# Patient Record
Sex: Female | Born: 1965
Health system: Southern US, Community
[De-identification: ages and names within clinical notes are randomized; demographics above are authoritative.]

## PROBLEM LIST (undated history)

## (undated) DIAGNOSIS — I10 Essential (primary) hypertension: Secondary | ICD-10-CM

## (undated) DIAGNOSIS — E785 Hyperlipidemia, unspecified: Secondary | ICD-10-CM

## (undated) DIAGNOSIS — F419 Anxiety disorder, unspecified: Secondary | ICD-10-CM

## (undated) DIAGNOSIS — F329 Major depressive disorder, single episode, unspecified: Secondary | ICD-10-CM

## (undated) DIAGNOSIS — F32A Depression, unspecified: Secondary | ICD-10-CM

## (undated) DIAGNOSIS — K219 Gastro-esophageal reflux disease without esophagitis: Secondary | ICD-10-CM

## (undated) DIAGNOSIS — K8020 Calculus of gallbladder without cholecystitis without obstruction: Secondary | ICD-10-CM

## (undated) HISTORY — DX: Essential (primary) hypertension: I10

## (undated) HISTORY — DX: Depression, unspecified: F32.A

## (undated) HISTORY — DX: Gastro-esophageal reflux disease without esophagitis: K21.9

## (undated) HISTORY — DX: Hyperlipidemia, unspecified: E78.5

## (undated) HISTORY — DX: Calculus of gallbladder without cholecystitis without obstruction: K80.20

## (undated) HISTORY — DX: Anxiety disorder, unspecified: F41.9

## (undated) HISTORY — DX: Major depressive disorder, single episode, unspecified: F32.9

---

## 2004-08-27 HISTORY — PX: CHOLECYSTECTOMY: SHX55

## 2009-08-27 HISTORY — PX: CERVICAL SPINE SURGERY: SHX589

## 2017-08-09 ENCOUNTER — Encounter: Payer: Self-pay | Admitting: Family Medicine

## 2017-08-09 ENCOUNTER — Ambulatory Visit (INDEPENDENT_AMBULATORY_CARE_PROVIDER_SITE_OTHER): Payer: BLUE CROSS/BLUE SHIELD | Admitting: Family Medicine

## 2017-08-09 VITALS — BP 126/80 | HR 79 | Temp 97.9°F | Resp 12 | Ht 68.39 in | Wt 222.4 lb

## 2017-08-09 DIAGNOSIS — L989 Disorder of the skin and subcutaneous tissue, unspecified: Secondary | ICD-10-CM | POA: Diagnosis not present

## 2017-08-09 DIAGNOSIS — I10 Essential (primary) hypertension: Secondary | ICD-10-CM

## 2017-08-09 DIAGNOSIS — Z1211 Encounter for screening for malignant neoplasm of colon: Secondary | ICD-10-CM

## 2017-08-09 DIAGNOSIS — E785 Hyperlipidemia, unspecified: Secondary | ICD-10-CM | POA: Insufficient documentation

## 2017-08-09 DIAGNOSIS — F3341 Major depressive disorder, recurrent, in partial remission: Secondary | ICD-10-CM

## 2017-08-09 DIAGNOSIS — Z1231 Encounter for screening mammogram for malignant neoplasm of breast: Secondary | ICD-10-CM

## 2017-08-09 DIAGNOSIS — K219 Gastro-esophageal reflux disease without esophagitis: Secondary | ICD-10-CM

## 2017-08-09 DIAGNOSIS — Z1239 Encounter for other screening for malignant neoplasm of breast: Secondary | ICD-10-CM

## 2017-08-09 DIAGNOSIS — Z23 Encounter for immunization: Secondary | ICD-10-CM | POA: Diagnosis not present

## 2017-08-09 LAB — LIPID PANEL
CHOLESTEROL: 254 mg/dL — AB (ref 0–200)
HDL: 71 mg/dL (ref >39.00)
LDL Cholesterol: 167 mg/dL — ABNORMAL HIGH (ref 0–99)
NonHDL: 183.14
Total CHOL/HDL Ratio: 4
Triglycerides: 83 mg/dL (ref 0.0–149.0)
VLDL: 16.6 mg/dL (ref 0.0–40.0)

## 2017-08-09 LAB — COMPREHENSIVE METABOLIC PANEL
ALT: 19 U/L (ref 0–35)
AST: 19 U/L (ref 0–37)
Albumin: 4.2 g/dL (ref 3.5–5.2)
Alkaline Phosphatase: 57 U/L (ref 39–117)
BUN: 17 mg/dL (ref 6–23)
CALCIUM: 9.2 mg/dL (ref 8.4–10.5)
CHLORIDE: 100 meq/L (ref 96–112)
CO2: 29 meq/L (ref 19–32)
Creatinine, Ser: 0.79 mg/dL (ref 0.40–1.20)
GFR: 81.61 mL/min (ref >60.00)
GLUCOSE: 102 mg/dL — AB (ref 70–99)
Potassium: 3.8 mEq/L (ref 3.5–5.1)
SODIUM: 138 meq/L (ref 135–145)
Total Bilirubin: 0.5 mg/dL (ref 0.2–1.2)
Total Protein: 6.9 g/dL (ref 6.0–8.3)

## 2017-08-09 LAB — VITAMIN D 25 HYDROXY (VIT D DEFICIENCY, FRACTURES): VITD: 17.41 ng/mL — ABNORMAL LOW (ref 30.00–100.00)

## 2017-08-09 MED ORDER — HYDROCHLOROTHIAZIDE 12.5 MG PO TABS: 12.5000 mg | ORAL_TABLET | Freq: Every day | ORAL | 2 refills | Status: DC

## 2017-08-09 MED ORDER — ESCITALOPRAM OXALATE 20 MG PO TABS: 20.0000 mg | ORAL_TABLET | Freq: Every day | ORAL | 2 refills | Status: DC

## 2017-08-09 MED ORDER — IRBESARTAN 300 MG PO TABS: 300.0000 mg | ORAL_TABLET | Freq: Every day | ORAL | 2 refills | Status: DC

## 2017-08-09 NOTE — Patient Instructions (Addendum)
A few things to remember from today's visit:   Gastroesophageal reflux disease, esophagitis presence not specified  Hypertension, essential, benign - Plan: Comprehensive metabolic panel, irbesartan (AVAPRO) 300 MG tablet, hydrochlorothiazide (HYDRODIURIL) 12.5 MG tablet  Hyperlipidemia, unspecified hyperlipidemia type - Plan: Lipid panel, Comprehensive metabolic panel  Depression, major, recurrent, in partial remission ([place]) - Plan: VITAMIN D 25 Hydroxy (Vit-D Deficiency, Fractures), escitalopram (LEXAPRO) 20 MG tablet  Breast cancer screening - Plan: MM SCREENING BREAST TOMO BILATERAL  Colon cancer screening - Plan: Ambulatory referral to Gastroenterology   GERD:  Avoid foods that make your symptoms worse, for example coffee, chocolate,pepermeint,alcohol, and greasy food. Raising the head of your bed about 6 inches may help with nocturnal symptoms.  Avoid tobacco use. Weight loss (if you are overweight). Avoid lying down for 3 hours after eating.  Instead 3 large meals daily try small and more frequent meals during the day.  Every medication have side effects and medications for GERD are not the exception.At this time I think benefit is greater than risk.    You should be evaluated immediately if bloody vomiting, bloody stools, black stools (like tar), difficulty swallowing, food gets stuck on the way down or choking when eating. Abnormal weight loss or severe abdominal pain.  If symptoms are not resolved sometimes endoscopy is necessary.   Please be sure medication list is accurate. If a new problem present, please set up appointment sooner than planned today.

## 2017-08-09 NOTE — Progress Notes (Signed)
HPI:   Angela Everett is a 51 y.o. female, who is here today to establish care.  Former PCP: N/A, she just moved from [place] in 04/2017. Last preventive routine visit: 03/2017  Chronic medical problems: HTN,HLD, depression,anxiety among some.  She does not exercise regularly and has not followed a healthy diet in the past 6 months. She is planning on starting a healthier life style.  Hypertension:   Dx in 1999. Currently on Irbesartan 300 mg daily and HCTZ 12.5 mg daily. Last eye exam 11/2016.   Father with Hx of Marfan synd , she is reporting negative cardiac work up in 2015, including echo.  She is taking medications as instructed, no side effects reported.  She has not noted unusual headache, visual changes, exertional chest pain, dyspnea,  focal weakness, or edema.    Hyperlipidemia:  Currently on non pharmacologic treatment. Following a low fat diet: Not consistently.  She took medication in the past, discontinued because she changed her diet and PCP felt like med was not needed.   Depression and anxiety:  Dx at at age 70. + PTSD. She was not longer following with psychiatrist, last time in 2009. She has been following with PCP. Currently she is on Celexa 20 mg daily.  She is sleeping "Ok",sometimes wakes up a few times per night.  She denies suicidal thoughts.   Concerns today: Dermatology referral. Skin lesion noted at least 4 years ago, not changed but she thinkks it should be evaluated by dermatologist. She was following with PCP annually.   GERD: She is not on PPI. Takes OTC Tums. Heartburn,exacerbated by certain food intake,mainly fast food.  Denies abdominal pain, nausea, vomiting, changes in bowel habits, blood in stool or melena.   Review of Systems  Constitutional: Positive for fatigue. Negative for activity change, appetite change and fever.  HENT: Negative for mouth sores, nosebleeds and trouble swallowing.   Eyes: Negative  for redness and visual disturbance.  Respiratory: Negative for cough, shortness of breath and wheezing.   Cardiovascular: Negative for chest pain, palpitations and leg swelling.  Gastrointestinal: Negative for abdominal pain, nausea and vomiting.       Negative for changes in bowel habits.  Endocrine: Negative for cold intolerance, heat intolerance, polydipsia, polyphagia and polyuria.  Genitourinary: Negative for decreased urine volume, dysuria and hematuria.  Musculoskeletal: Negative for gait problem and myalgias.  Neurological: Negative for seizures, syncope, weakness and headaches.  Psychiatric/Behavioral: Positive for sleep disturbance. Negative for confusion, hallucinations and suicidal ideas. The patient is nervous/anxious.       No current outpatient medications on file prior to visit.   No current facility-administered medications on file prior to visit.      Past Medical History:  Diagnosis Date  . Anxiety   . Depression   . GERD (gastroesophageal reflux disease)   . Hyperlipidemia   . Hypertension    Allergies  Allergen Reactions  . Latex     Family History  Problem Relation Age of Onset  . Depression Mother   . Heart disease Mother   . Hypertension Mother   . Birth defects Father   . Cancer Father   . Heart disease Father   . Alcohol abuse Father   . Marfan syndrome Father   . ADD / ADHD Daughter   . Alcohol abuse Daughter   . Depression Daughter   . Drug abuse Daughter   . Learning disabilities Daughter   . Depression Son   . Heart  disease Maternal Grandmother   . Heart attack Maternal Grandmother   . Cancer Paternal Grandmother   . Early death Paternal Grandmother   . Alcohol abuse Paternal Grandfather   . Cancer Paternal Grandfather   . Depression Paternal Grandfather   . Depression Daughter   . Hearing loss Daughter     Social History   Socioeconomic History  . Marital status: Legally Separated    Spouse name: None  . Number of  children: None  . Years of education: None  . Highest education level: None  Social Needs  . Financial resource strain: None  . Food insecurity - worry: None  . Food insecurity - inability: None  . Transportation needs - medical: None  . Transportation needs - non-medical: None  Occupational History  . None  Tobacco Use  . Smoking status: Never Smoker  . Smokeless tobacco: Never Used  Substance and Sexual Activity  . Alcohol use: No    Frequency: Never  . Drug use: No  . Sexual activity: No  Other Topics Concern  . None  Social History Narrative  . None    Vitals:   08/09/17 0744  BP: 126/80  Pulse: 79  Resp: 12  Temp: 97.9 F (36.6 C)  SpO2: 97%    Body mass index is 33.43 kg/m.   Physical Exam  Constitutional: She is oriented to person, place, and time. She appears well-developed. No distress.  HENT:  Head: Atraumatic.  Mouth/Throat: Oropharynx is clear and moist and mucous membranes are normal.  Eyes: Conjunctivae and EOM are normal. Pupils are equal, round, and reactive to light.  Cardiovascular: Normal rate and regular rhythm.  No murmur heard. Pulses:      Dorsalis pedis pulses are 2+ on the right side, and 2+ on the left side.  Respiratory: Effort normal and breath sounds normal. No respiratory distress.  GI: Soft. She exhibits no mass. There is no hepatomegaly. There is no tenderness.  Musculoskeletal: She exhibits no edema.  Lymphadenopathy:    She has no cervical adenopathy.  Neurological: She is alert and oriented to person, place, and time. She has normal strength. Gait normal.  Skin: Skin is warm. Lesion noted. No rash noted. No erythema.     6 mm, mildly hard, rounded lesion.Regular borders, dry,no tender.   Psychiatric: Her mood appears anxious.  Fairly groomed, good eye contact.      ASSESSMENT AND PLAN:   Ms. Abella was seen today for establish care.  Diagnoses and all orders for this visit:  Lab Results  Component Value Date    ALT 19 08/09/2017   AST 19 08/09/2017   ALKPHOS 57 08/09/2017   BILITOT 0.5 08/09/2017   Lab Results  Component Value Date   CREATININE 0.79 08/09/2017   BUN 17 08/09/2017   NA 138 08/09/2017   K 3.8 08/09/2017   CL 100 08/09/2017   CO2 29 08/09/2017   Lab Results  Component Value Date   CHOL 254 (H) 08/09/2017   HDL 71.00 08/09/2017   LDLCALC 167 (H) 08/09/2017   TRIG 83.0 08/09/2017   CHOLHDL 4 08/09/2017    Gastroesophageal reflux disease, esophagitis presence not specified  GERD precautions discussed. She agrees with starting Omeprazole 40 mg daily for 2-3 months,then dose could be decreased to 20 mg if symptoms well controlled.  Hypertension, essential, benign  Adequately controlled. No changes in current management. DASH diet recommended. Eye exam recommended annually. F/U in 5-6 months, before if needed.  -  Comprehensive metabolic panel -     irbesartan (AVAPRO) 300 MG tablet; Take 1 tablet (300 mg total) by mouth daily. -     hydrochlorothiazide (HYDRODIURIL) 12.5 MG tablet; Take 1 tablet (12.5 mg total) by mouth daily.  Hyperlipidemia, unspecified hyperlipidemia type  Continue non pharmacologic treatment. We will follow labs done today and will give further recommendations accordingly.  The 10-year ASCVD risk score Mikey Bussing DC Brooke Bonito., et al., 2013) is: 1.6%   Values used to calculate the score:     Age: 45 years     Sex: Female     Is Non-Hispanic African American: No     Diabetic: No     Tobacco smoker: No     Systolic Blood Pressure: 253 mmHg     Is BP treated: Yes     HDL Cholesterol: 71 mg/dL     Total Cholesterol: 254 mg/dL  -     Lipid panel -     Comprehensive metabolic panel  Depression, major, recurrent, in partial remission (HCC)  Stable otherwise. No changes in current management. Instructed about warning signs. F/U in 5 months.  -     VITAMIN D 25 Hydroxy (Vit-D Deficiency, Fractures) -     escitalopram (LEXAPRO) 20 MG tablet;  Take 1 tablet (20 mg total) by mouth daily.  Breast cancer screening -     MM SCREENING BREAST TOMO BILATERAL; Future  Colon cancer screening -     Ambulatory referral to Gastroenterology  Skin lesion of left arm  ? Fibroma. Dermatology referral placed as requested.  -     Ambulatory referral to Dermatology       Betty G. Martinique, MD  [facility]. [place] office.

## 2017-08-10 ENCOUNTER — Encounter: Payer: Self-pay | Admitting: Family Medicine

## 2017-08-26 ENCOUNTER — Encounter: Payer: Self-pay | Admitting: *Deleted

## 2017-10-10 ENCOUNTER — Encounter: Payer: Self-pay | Admitting: Family Medicine

## 2017-11-25 ENCOUNTER — Encounter: Payer: Self-pay | Admitting: Family Medicine

## 2017-11-25 ENCOUNTER — Ambulatory Visit: Payer: BLUE CROSS/BLUE SHIELD | Admitting: Family Medicine

## 2017-11-25 ENCOUNTER — Ambulatory Visit: Payer: Self-pay | Admitting: *Deleted

## 2017-11-25 VITALS — BP 138/70 | HR 104 | Temp 98.2°F | Wt 223.0 lb

## 2017-11-25 DIAGNOSIS — L247 Irritant contact dermatitis due to plants, except food: Secondary | ICD-10-CM

## 2017-11-25 MED ORDER — PREDNISONE 20 MG PO TABS: ORAL_TABLET | ORAL | 0 refills | Status: DC

## 2017-11-25 MED ORDER — CETIRIZINE HCL 10 MG PO TABS: 10.0000 mg | ORAL_TABLET | Freq: Every day | ORAL | 0 refills | Status: AC

## 2017-11-25 MED ORDER — METHYLPREDNISOLONE ACETATE 80 MG/ML IJ SUSP: 80.0000 mg | Freq: Once | INTRAMUSCULAR | Status: DC

## 2017-11-25 NOTE — Progress Notes (Signed)
Subjective:    Patient ID: Angela Everett, female    DOB: [birth year], 52 y.o.   MRN: [id number]  Chief Complaint  Patient presents with  . Rash  . Urticaria    HPI Patient was seen today for rash.  Patient states on Saturday she was doing yard work including tearing down bushes and moving an old fence.  Pt was wearing a short sleeve t-shirt.  Pt states since then she has noticed edema and vesicular rash on her arms and L face, which is also pruritic.  Patient endorses having sensitive skin and issues with idiopathic hives.  Patient has taken Benadryl 200 mg for her symptoms today with no relief.  Patient states the right side of her face is now starting to become irritated.  Past Medical History:  Diagnosis Date  . Anxiety   . Depression   . GERD (gastroesophageal reflux disease)   . Hyperlipidemia   . Hypertension     Allergies  Allergen Reactions  . Latex     ROS General: Denies fever, chills, night sweats, changes in weight, changes in appetite HEENT: Denies headaches, ear pain, changes in vision, rhinorrhea, sore throat CV: Denies CP, palpitations, SOB, orthopnea Pulm: Denies SOB, cough, wheezing GI: Denies abdominal pain, nausea, vomiting, diarrhea, constipation GU: Denies dysuria, hematuria, frequency, vaginal discharge Msk: Denies muscle cramps, joint pains Neuro: Denies weakness, numbness, tingling Skin: Denies rashes, bruising  +rash on forearms and face Psych: Denies depression, anxiety, hallucinations     Objective:    Blood pressure 138/70, pulse (!) 104, temperature 98.2 F (36.8 C), temperature source Oral, weight 223 lb (101.2 kg), SpO2 98 %.   Gen. Pleasant, well-nourished, in no distress, normal affect   HEENT: [place]/AT, face symmetric, no scleral icterus, PERRLA, nares patent without drainage, pharynx without erythema or exudate. Lungs: no accessory muscle use, CTAB, no wheezes or rales Cardiovascular: RRR, no m/r/g, no peripheral edema Neuro:   A&Ox3, CN II-XII intact, normal gait Skin:  Warm, dry.  A pruritic line of vesicles on bilateral forearms with erythema.  Left sided facial and periorbital edema noted.  Mild erythema in plaques on right face and left neck.   Wt Readings from Last 3 Encounters:  11/25/17 223 lb (101.2 kg)  08/09/17 222 lb 6 oz (100.9 kg)    Lab Results  Component Value Date   GLUCOSE 102 (H) 08/09/2017   CHOL 254 (H) 08/09/2017   TRIG 83.0 08/09/2017   HDL 71.00 08/09/2017   LDLCALC 167 (H) 08/09/2017   ALT 19 08/09/2017   AST 19 08/09/2017   NA 138 08/09/2017   K 3.8 08/09/2017   CL 100 08/09/2017   CREATININE 0.79 08/09/2017   BUN 17 08/09/2017   CO2 29 08/09/2017    Assessment/Plan:  Irritant contact dermatitis due to plants, except food  -Likely secondary to dairy Urishiol oil from poison ivy, sumac, or oak. -Patient encouraged not to scratch as to prevent secondary bacterial infection -Given Depo-Medrol IM in clinic -Given Rx for prednisone taper -Can also use cortisone as needed -Given handout -Patient given RTC or ED precautions - Plan: predniSONE (DELTASONE) 20 MG tablet, cetirizine (ZYRTEC) 10 MG tablet, methylPREDNISolone acetate (DEPO-MEDROL) injection 80 mg  Follow-up PRN  Grier Mitts, MD

## 2017-11-25 NOTE — Patient Instructions (Signed)
Poison Oak Dermatitis  Poison oak dermatitis is inflammation of the skin that is caused by contact with the allergens on the leaves of the poison oak (toxicodendron) plant. The skin reaction often includes redness, swelling, blisters, and extreme itching.  What are the causes?  This condition is caused by a specific chemical (urushiol) that is found in the sap of the poison oak plant. This chemical is sticky and it can be easily spread to people, animals, and objects. You can get poison oak dermatitis by:  · Having direct contact with a poison oak plant.  · Touching animals, other people, or objects that have come in contact with poison oak and have the chemical on them.    What increases the risk?  This condition is more likely to develop in people who:  · Are outdoors often.  · Go outdoors without wearing protective clothing, such as closed shoes, long pants, and a long-sleeved shirt.    What are the signs or symptoms?  Symptoms of this condition include:  · Redness of the skin.  · A rash that may develop blisters.  · Extreme itching.  · Swelling. This may occur if the reaction is more severe.    Symptoms usually last for 1-2 weeks. However, the first time you develop this condition, symptoms may last 3-4 weeks.  How is this diagnosed?  This condition may be diagnosed based on your symptoms and a physical exam. Your health care provider may also ask you about any recent outdoor activity.  How is this treated?  Treatment for this condition will vary depending on how severe it is. Treatment may include:  · Hydrocortisone creams or calamine lotions to relieve itching.  · Oatmeal baths to soothe the skin.  · Over-the-counter antihistamine tablets.  · Oral steroid medicine for more severe outbreaks.    Follow these instructions at home:  · Take or apply over-the-counter and prescription medicines only as told by your health care provider.  · Wash exposed skin as soon as possible with soap and cold water.  · Use  hydrocortisone creams or calamine lotion as needed to soothe the skin and relieve itching.  · Take oatmeal baths as needed. Use colloidal oatmeal. You can get this at your local pharmacy or grocery store. Follow the instructions on the packaging.  · Do not scratch or rub your skin.  · While you have the rash, wash clothes right after you wear them.  How is this prevented?  · Learn to identify the poison oak plant and avoid contact with the plant. This plant can be recognized by the number of leaves. Generally, poison oak has three leaves with flowering branches on a single stem. The leaves are often a bit fuzzy and have a toothlike edge.  · If you have been exposed to poison oak, thoroughly wash with soap and water right away. You have about 30 minutes to remove the plant resin before it will cause the rash. Be sure to wash under your fingernails because any plant resin there will continue to spread the rash.  · When hiking or camping, wear clothes that will help you avoid exposure on the skin. This includes long pants, a long-sleeved shirt, tall socks, and hiking boots. You can also apply preventive lotion to your skin to help limit exposure.  · If you suspect that your clothes or outdoor gear came in contact with poison oak, rinse them off outside with a garden hose before bringing them inside your house.    Contact a health care provider if:  · You have open sores in the rash area.  · You have more redness, swelling, or pain in the affected area.  · You have redness that spreads beyond the rash area.  · You have fluid, blood, or pus coming from the affected area.  · You have a fever.  · You have a rash over a large area of your body.  · You have a rash on your eyes, mouth, or genitals.  · Your rash does not improve after a few days.  Get help right away if:  · Your face swells or your eyes swell shut.  · You have trouble breathing.  · You have trouble swallowing.  This information is not intended to replace advice  given to you by your health care provider. Make sure you discuss any questions you have with your health care provider.  Document Released: 02/17/2003 Document Revised: 01/19/2016 Document Reviewed: 01/19/2015  Elsevier Interactive Patient Education © 2018 Elsevier Inc.

## 2017-11-25 NOTE — Telephone Encounter (Signed)
  Reason for Disposition . [1] Severe poison ivy, oak, or sumac reaction in the past AND [2] face involved  Answer Assessment - Initial Assessment Questions Left side of face with tiny blisters and hives that itch. Some swelling of face where the rash is noticed. Denies any difficultly breathing or chest pain. She noticed the rash 24 hours after doing yard work on Saturday.    1. APPEARANCE of RASH: "Describe the rash."     Hives and tiny blisters. 2. LOCATION: "Where is the rash located?"  Forearm hives, yesterday noticed a straight line path of tiny blister. Left side of face has hives and several small blisters. 3. SIZE: "How large is the rash?"      Spread out tiny blisters 4. ONSET: "When did the rash begin?"     Sunday morning 24 hours after pulling weeds on Saturday. 5. ITCHING: "Does the rash itch?" If so, ask: "How bad is it?"   - MILD - doesn't interfere with normal activities   - MODERATE-SEVERE: interferes with work, school, sleep, or other activities     Yes, Mild. 6. PREGNANCY: "Is there any chance you are pregnant?" "When was your last menstrual period?" no  Protocols used: POISON IVY - OAK - SUMAC-A-AH

## 2017-11-26 ENCOUNTER — Ambulatory Visit: Payer: BLUE CROSS/BLUE SHIELD | Admitting: Family Medicine

## 2018-02-12 ENCOUNTER — Ambulatory Visit: Payer: Self-pay

## 2018-02-12 NOTE — Telephone Encounter (Signed)
Returned call to pt. to discuss ankle wound.  Stated she noticed right lateral ankle sore about one month ago.  Reported this is located directly over a tatoo, that she has had for 20 years.  Reported about 1.5 weeks ago, she started treating it by soaking in Epsom Salts, and applying Neosporin Oint.  Described area that "looks like a crater; feels like it has a scab, but doesn't look like a scab."  Reported the size is just smaller than a dime, is sore to touch, and has intermittent redness.  Stated it intermittently leaks a "clear, dark gray fluid."  Denied fever/ chills.  Stated she has been very diligent to treat it for the last 1.5 weeks, and has not seen any healing.  Also, reported pain and stiffness in hands and elbows.  Appt. offered for 6/20; unable to accept the appt. on 6/20, due to her job.  Appt given for 6/21 @ 10:30 AM.  Agrees with plan. Care Advice given per protocol  Verb. Understanding.         Reason for Disposition . [1] Using antibiotic ointment > 1 week AND [2] sore not completely healed  Answer Assessment - Initial Assessment Questions 1. APPEARANCE of SORES: "What do the sores look like?"     Looks like a crater; it feels like a scab , but doesn't look like a scab;  hard center like a lump, sore to touch,  intermittent redness, 2. NUMBER: "How many sores are there?"     One  3. SIZE: "How big is the largest sore?"     Diameter is about just less than dime size 4. LOCATION: "Where are the sores located?"     Lateral right ankle 5. ONSET: "When did the sores begin?"     About one month ago 6. CAUSE: "What do you think is causing the sores?"     Unknown 7. OTHER SYMPTOMS: "Do you have any other symptoms?" (e.g., fever, new weakness)     Denied fever / chills; intermittent drainage of clear, dark gray  Protocols used: SORES-A-AH  Message from Cleaster Corin, NT sent at 02/12/2018 12:09 PM EDT   Summary: wound on anle not healing           Pt. Calling about right  Ankle open wound that hasn't healed and its been about a month now. Pt. Has noticed some drainage (clear and dark gray)red, hard center, and sore to the touch.

## 2018-02-14 ENCOUNTER — Ambulatory Visit: Payer: BLUE CROSS/BLUE SHIELD | Admitting: Family Medicine

## 2018-02-14 ENCOUNTER — Encounter: Payer: Self-pay | Admitting: Family Medicine

## 2018-02-14 VITALS — BP 124/76 | HR 80 | Temp 98.0°F | Resp 12 | Ht 68.39 in | Wt 225.1 lb

## 2018-02-14 DIAGNOSIS — M7712 Lateral epicondylitis, left elbow: Secondary | ICD-10-CM | POA: Diagnosis not present

## 2018-02-14 DIAGNOSIS — L03115 Cellulitis of right lower limb: Secondary | ICD-10-CM | POA: Diagnosis not present

## 2018-02-14 DIAGNOSIS — M2550 Pain in unspecified joint: Secondary | ICD-10-CM | POA: Diagnosis not present

## 2018-02-14 MED ORDER — DOXYCYCLINE HYCLATE 100 MG PO TABS: 100.0000 mg | ORAL_TABLET | Freq: Two times a day (BID) | ORAL | 0 refills | Status: AC

## 2018-02-14 NOTE — Patient Instructions (Addendum)
A few things to remember from today's visit:   Cellulitis of right lower extremity - Plan: doxycycline (VIBRA-TABS) 100 MG tablet  Polyarthralgia  Lateral epicondylitis of left elbow  Osteoarthritis most likely. It is a chronic condition and gets worse with age.  The following may help:  Over the counter topical medications: Icy Hot or Asper cream with Lidocaine. Tai Chi or PT. Fall prevention. Avoid weight gain. Fish oil, over the counter Megared for example, 2 capsules daily.    Tennis Elbow Tennis elbow is puffiness (inflammation) of the outer tendons of your forearm close to your elbow. Your tendons attach your muscles to your bones. Tennis elbow can happen in any sport or job in which you use your elbow too much. It is caused by doing the same motion over and over. Tennis elbow can cause:  Pain and tenderness in your forearm and the outer part of your elbow.  A burning feeling. This runs from your elbow through your arm.  Weak grip in your hands.  Follow these instructions at home: Activity  Rest your elbow and wrist as told by your doctor. Try to avoid any activities that caused the problem until your doctor says that you can do them again.  If a physical therapist teaches you exercises, do all of them as told.  If you lift an object, lift it with your palm facing up. This is easier on your elbow. Lifestyle  If your tennis elbow is caused by sports, check your equipment and make sure that: ? You are using it correctly. ? It fits you well.  If your tennis elbow is caused by work, take breaks often, if you are able. Talk with your manager about doing your work in a way that is safe for you. ? If your tennis elbow is caused by computer use, talk with your manager about any changes that can be made to your work setup. General instructions  If told, apply ice to the painful area: ? Put ice in a plastic bag. ? Place a towel between your skin and the bag. ? Leave the  ice on for 20 minutes, 2-3 times per day.  Take medicines only as told by your doctor.  If you were given a brace, wear it as told by your doctor.  Keep all follow-up visits as told by your doctor. This is important. Contact a doctor if:  Your pain does not get better with treatment.  Your pain gets worse.  You have weakness in your forearm, hand, or fingers.  You cannot feel your forearm, hand, or fingers. This information is not intended to replace advice given to you by your health care provider. Make sure you discuss any questions you have with your health care provider. Document Released: 01/31/2010 Document Revised: 04/12/2016 Document Reviewed: 08/09/2014 Elsevier Interactive Patient Education  [employer].   Please be sure medication list is accurate. If a new problem present, please set up appointment sooner than planned today.

## 2018-02-14 NOTE — Progress Notes (Signed)
ACUTE VISIT   HPI:  Chief Complaint  Patient presents with  . Open sore on right ankle    not healing, tender to touch, having some joint pain and soreness for 1 month    Angela Everett is a 52 y.o. female, who is here today complaining of a month os sore area on right ankle. She was treated for poison oak a couple months ago, rash has improved,she wonders if sore lesion is related. No pruritic.   Lesion is tender upon palpation. She has treated area with alcohol and Neosporin.  No fever,chills,or myalgias.  Recently she was exposed to a viral illness, she did not feel great but resolved.   She is also c/o joint pain for at least 6 months: Hands IP joints and left heel pain. When she was on Prednisone for poison oak dermatitis joint pain improved. No edema or erythema.  No limitation of ROM. Pain is exacerbated by movement.  + Stiffness. Symptoms seems to be worse for the past 2 weeks with new onset of moderate elbows pain (L>R). No Hx of trauma. Exacerbated with palpation and movement.  She has not taken OTC analgesic.     Review of Systems  Constitutional: Negative for appetite change, chills, fatigue and fever.  HENT: Negative for congestion, mouth sores, sneezing and sore throat.   Eyes: Negative for discharge and redness.  Respiratory: Negative for shortness of breath and wheezing.   Cardiovascular: Negative for palpitations and leg swelling.  Gastrointestinal: Negative for abdominal pain, nausea and vomiting.  Musculoskeletal: Positive for arthralgias. Negative for gait problem, joint swelling and myalgias.  Skin: Positive for rash. Negative for wound.  Allergic/Immunologic: Positive for environmental allergies.  Neurological: Negative for weakness, numbness and headaches.  Psychiatric/Behavioral: Negative for confusion. The patient is nervous/anxious.       Current Outpatient Medications on File Prior to Visit  Medication Sig  Dispense Refill  . cetirizine (ZYRTEC) 10 MG tablet Take 1 tablet (10 mg total) by mouth daily. 30 tablet 0  . escitalopram (LEXAPRO) 20 MG tablet Take 1 tablet (20 mg total) by mouth daily. 90 tablet 2  . hydrochlorothiazide (HYDRODIURIL) 12.5 MG tablet Take 1 tablet (12.5 mg total) by mouth daily. 90 tablet 2  . irbesartan (AVAPRO) 300 MG tablet Take 1 tablet (300 mg total) by mouth daily. 90 tablet 2  . diphenhydrAMINE (BENADRYL) 50 MG tablet Take 50 mg by mouth at bedtime as needed for itching.    . predniSONE (DELTASONE) 20 MG tablet Take 60 mg (3 tabs) x 4 days, 40 mg (2tabs) x 4 days, 20 mg (1 tab) x 4 days, then 10 mg (1/2 tab) x 4 days. (Patient not taking: Reported on 02/14/2018) 26 tablet 0   Current Facility-Administered Medications on File Prior to Visit  Medication Dose Route Frequency Provider Last Rate Last Dose  . methylPREDNISolone acetate (DEPO-MEDROL) injection 80 mg  80 mg Intramuscular Once Billie Ruddy, MD         Past Medical History:  Diagnosis Date  . Anxiety   . Depression   . GERD (gastroesophageal reflux disease)   . Hyperlipidemia   . Hypertension    Allergies  Allergen Reactions  . Latex     Social History   Socioeconomic History  . Marital status: Legally Separated    Spouse name: Not on file  . Number of children: Not on file  . Years of education: Not on file  . Highest education level:  Not on file  Occupational History  . Not on file  Social Needs  . Financial resource strain: Not on file  . Food insecurity:    Worry: Not on file    Inability: Not on file  . Transportation needs:    Medical: Not on file    Non-medical: Not on file  Tobacco Use  . Smoking status: Never Smoker  . Smokeless tobacco: Never Used  Substance and Sexual Activity  . Alcohol use: No    Frequency: Never  . Drug use: No  . Sexual activity: Never  Lifestyle  . Physical activity:    Days per week: 0 days    Minutes per session: 0 min  . Stress: Not on  file  Relationships  . Social connections:    Talks on phone: Not on file    Gets together: Not on file    Attends religious service: Not on file    Active member of club or organization: Not on file    Attends meetings of clubs or organizations: Not on file    Relationship status: Not on file  Other Topics Concern  . Not on file  Social History Narrative  . Not on file    Vitals:   02/14/18 1041  BP: 124/76  Pulse: 80  Resp: 12  Temp: 98 F (36.7 C)  SpO2: 99%   Body mass index is 33.84 kg/m.    Physical Exam  Nursing note and vitals reviewed. Constitutional: She is oriented to person, place, and time. She appears well-developed. No distress.  HENT:  Head: Normocephalic and atraumatic.  Mouth/Throat: Mucous membranes are normal.  Eyes: Conjunctivae are normal.  Cardiovascular: Normal rate and regular rhythm.  Pulses:      Dorsalis pedis pulses are 2+ on the right side.  Respiratory: Effort normal and breath sounds normal. No respiratory distress.  Musculoskeletal: She exhibits no edema.       Left elbow: She exhibits normal range of motion and no deformity.       Left forearm: She exhibits tenderness.  Left elbow tenderness upon palpation of lateral epicondyle, no edema or erythema. Pain is also elicited with supination with resistance. Normal ROM.  IP no deformities,no signs of synovitis  Otherwise normal ROM.  Lymphadenopathy:    She has no cervical adenopathy.  Neurological: She is alert and oriented to person, place, and time.  Skin: Skin is warm. Rash noted. There is erythema.     On lateral aspect of RLE, mid calf (on tattoo) with induration (2.5-3 cm) and erythema + central 3 mm brown crust. Tenderness with palpation, no fluctuant area, and local heat.  Psychiatric: She has a normal mood and affect.  Well groomed, good eye contact.      ASSESSMENT AND PLAN:   Angela Everett was seen today for open sore on right ankle.  Diagnoses and all orders  for this visit:  Cellulitis of right lower extremity  Avoid alcohol on lesion. Keep area clear with soap and water. Some side effects of abx discussed. She will monitor for worsening symptoms. F/U as needed.  -     doxycycline (VIBRA-TABS) 100 MG tablet; Take 1 tablet (100 mg total) by mouth 2 (two) times daily for 7 days.  Polyarthralgia  Possible etiologies discussed. Most likely OA. Educated about Dx. OTC Tylenol 650 mg TID as needed.  Lateral epicondylitis of left elbow  Local ice and OTC icy hot with lidocaine may help.  I do not think imaging  is needed at this time. She feels like pain is improving,so she prefers to hold on PT. OTC splints may help. F/U as needed      Return if symptoms worsen or fail to improve.        Jaymar Loeber G. Martinique, MD  [facility]. [place] office.

## 2018-02-21 ENCOUNTER — Ambulatory Visit: Payer: BLUE CROSS/BLUE SHIELD | Admitting: Family Medicine

## 2018-02-21 ENCOUNTER — Encounter: Payer: Self-pay | Admitting: Family Medicine

## 2018-02-21 VITALS — BP 120/82 | HR 96 | Temp 98.4°F | Ht 68.0 in | Wt 221.0 lb

## 2018-02-21 DIAGNOSIS — M2550 Pain in unspecified joint: Secondary | ICD-10-CM | POA: Diagnosis not present

## 2018-02-21 DIAGNOSIS — R509 Fever, unspecified: Secondary | ICD-10-CM | POA: Diagnosis not present

## 2018-02-21 LAB — COMPREHENSIVE METABOLIC PANEL
ALT: 16 U/L (ref 0–35)
AST: 22 U/L (ref 0–37)
Albumin: 4.2 g/dL (ref 3.5–5.2)
Alkaline Phosphatase: 59 U/L (ref 39–117)
BUN: 11 mg/dL (ref 6–23)
CALCIUM: 9.3 mg/dL (ref 8.4–10.5)
CHLORIDE: 102 meq/L (ref 96–112)
CO2: 28 meq/L (ref 19–32)
Creatinine, Ser: 0.84 mg/dL (ref 0.40–1.20)
GFR: 75.87 mL/min (ref >60.00)
GLUCOSE: 96 mg/dL (ref 70–99)
Potassium: 3.8 mEq/L (ref 3.5–5.1)
Sodium: 139 mEq/L (ref 135–145)
Total Bilirubin: 0.4 mg/dL (ref 0.2–1.2)
Total Protein: 7.3 g/dL (ref 6.0–8.3)

## 2018-02-21 LAB — CBC WITH DIFFERENTIAL/PLATELET
BASOS PCT: 0.6 % (ref 0.0–3.0)
Basophils Absolute: 0 10*3/uL (ref 0.0–0.1)
EOS PCT: 9.1 % — AB (ref 0.0–5.0)
Eosinophils Absolute: 0.2 10*3/uL (ref 0.0–0.7)
HCT: 41.9 % (ref 36.0–46.0)
HEMOGLOBIN: 14.1 g/dL (ref 12.0–15.0)
Lymphocytes Relative: 44.3 % (ref 12.0–46.0)
Lymphs Abs: 1.1 10*3/uL (ref 0.7–4.0)
MCHC: 33.6 g/dL (ref 30.0–36.0)
MCV: 92.3 fl (ref 78.0–100.0)
Monocytes Absolute: 0.3 10*3/uL (ref 0.1–1.0)
Monocytes Relative: 11 % (ref 3.0–12.0)
NEUTROS ABS: 0.9 10*3/uL — AB (ref 1.4–7.7)
Neutrophils Relative %: 35 % — ABNORMAL LOW (ref 43.0–77.0)
Platelets: 253 10*3/uL (ref 150.0–400.0)
RBC: 4.54 Mil/uL (ref 3.87–5.11)
RDW: 13.5 % (ref 11.5–15.5)
WBC: 2.5 10*3/uL — AB (ref 4.0–10.5)

## 2018-02-21 LAB — C-REACTIVE PROTEIN: CRP: 0.7 mg/dL (ref 0.5–20.0)

## 2018-02-21 LAB — SEDIMENTATION RATE: Sed Rate: 33 mm/hr — ABNORMAL HIGH (ref 0–30)

## 2018-02-21 LAB — TSH: TSH: 0.75 u[IU]/mL (ref 0.35–4.50)

## 2018-02-21 MED ORDER — MUPIROCIN 2 % EX OINT: 1.0000 "application " | TOPICAL_OINTMENT | Freq: Two times a day (BID) | CUTANEOUS | 0 refills | Status: DC

## 2018-02-21 NOTE — Patient Instructions (Signed)
Myofascial Pain Syndrome and Fibromyalgia Myofascial pain syndrome and fibromyalgia are both pain disorders. This pain may be felt mainly in your muscles.  Myofascial pain syndrome: ? Always has trigger points or tender points in the muscle that will cause pain when pressed. The pain may come and go. ? Usually affects your neck, upper back, and shoulder areas. The pain often radiates into your arms and hands.  Fibromyalgia: ? Has muscle pains and tenderness that come and go. ? Is often associated with fatigue and sleep disturbances. ? Has trigger points. ? Tends to be long-lasting (chronic), but is not life-threatening.  Fibromyalgia and myofascial pain are not the same. However, they often occur together. If you have both conditions, each can make the other worse. Both are common and can cause enough pain and fatigue to make day-to-day activities difficult. What are the causes? The exact causes of fibromyalgia and myofascial pain are not known. People with certain gene types may be more likely to develop fibromyalgia. Some factors can be triggers for both conditions, such as:  Spine disorders.  Arthritis.  Severe injury (trauma) and other physical stressors.  Being under a lot of stress.  A medical illness.  What are the signs or symptoms? Fibromyalgia The main symptom of fibromyalgia is widespread pain and tenderness in your muscles. This can vary over time. Pain is sometimes described as stabbing, shooting, or burning. You may have tingling or numbness, too. You may also have sleep problems and fatigue. You may wake up feeling tired and groggy (fibro fog). Other symptoms may include:  Bowel and bladder problems.  Headaches.  Visual problems.  Problems with odors and noises.  Depression or mood changes.  Painful menstrual periods (dysmenorrhea).  Dry skin or eyes.  Myofascial pain syndrome Symptoms of myofascial pain syndrome include:  Tight, ropy bands of  muscle.  Uncomfortable sensations in muscular areas, such as: ? Aching. ? Cramping. ? Burning. ? Numbness. ? Tingling. ? Muscle weakness.  Trouble moving certain muscles freely (range of motion).  How is this diagnosed? There are no specific tests to diagnose fibromyalgia or myofascial pain syndrome. Both can be hard to diagnose because their symptoms are common in many other conditions. Your health care provider may suspect one or both of these conditions based on your symptoms and medical history. Your health care provider will also do a physical exam. The key to diagnosing fibromyalgia is having pain, fatigue, and other symptoms for more than three months that cannot be explained by another condition. The key to diagnosing myofascial pain syndrome is finding trigger points in muscles that are tender and cause pain elsewhere in your body (referred pain). How is this treated? Treating fibromyalgia and myofascial pain often requires a team of health care providers. This usually starts with your primary provider and a physical therapist. You may also find it helpful to work with alternative health care providers, such as massage therapists or acupuncturists. Treatment for fibromyalgia may include medicines. This may include nonsteroidal anti-inflammatory drugs (NSAIDs), along with other medicines. Treatment for myofascial pain may also include:  NSAIDs.  Cooling and stretching of muscles.  Trigger point injections.  Sound wave (ultrasound) treatments to stimulate muscles.  Follow these instructions at home:  Take medicines only as directed by your health care provider.  Exercise as directed by your health care provider or physical therapist.  Try to avoid stressful situations.  Practice relaxation techniques to control your stress. You may want to try: ? Biofeedback. ? Visual   imagery. ? Hypnosis. ? Muscle relaxation. ? Yoga. ? Meditation.  Talk to your health care provider  about alternative treatments, such as acupuncture or massage treatment.  Maintain a healthy lifestyle. This includes eating a healthy diet and getting enough sleep.  Consider joining a support group.  Do not do activities that stress or strain your muscles. That includes repetitive motions and heavy lifting. Where to find more information:  National Fibromyalgia Association: www.fmaware.org  Arthritis Foundation: www.arthritis.org  American Chronic Pain Association: www.theacpa.org/condition/myofascial-pain Contact a health care provider if:  You have new symptoms.  Your symptoms get worse.  You have side effects from your medicines.  You have trouble sleeping.  Your condition is causing depression or anxiety. This information is not intended to replace advice given to you by your health care provider. Make sure you discuss any questions you have with your health care provider. Document Released: 08/13/2005 Document Revised: 01/19/2016 Document Reviewed: 05/19/2014 Elsevier Interactive Patient Education  2018 Elsevier Inc.  

## 2018-02-21 NOTE — Progress Notes (Signed)
Patient: Angela Everett MRN: [id number] DOB: 1966/04/15 PCP: Martinique, Betty G, MD   I acted as a scribe for Dr. Bonney Leitz, LPN  Subjective:  Chief Complaint  Patient presents with  . Fever    102 last night, 101 this AM  . Joint Pain    HPI: The patient is a 52 y.o. female who presents today for fever and joint pain. She has been on doxycyline for the past 6 days and treated for cellulitis for an area on her right ankle.she woke up with chills and fever to 102 at 2AM and then 101 about 9Am and then it was 99.6 at 11 and now it is gone. She has taken no anti-pyretics. I asked her if anything has changed in the last week and she states she is feeling "cruddy." She has a lot of joint pain in her hands, knees and achy everywhere. This has been going on for a little longer, but it seems to have escalated this week. 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 She has a headache as well and is just tired and achy. She had some nausea yesterday, but this past. She has no recollection of a tick bite.    Overall joint pain: pain in neck, elbows, knees, upper back, hands. No erythema or edema in joints that she has noticed. Has a hard time opening jars. history in dad with possible RA and marfan's syndrome.    Review of Systems  Constitutional: Positive for chills, fatigue and fever.  HENT: Negative for postnasal drip and sore throat.   Eyes: Negative for pain and visual disturbance.  Respiratory: Positive for cough. Negative for shortness of breath.   Cardiovascular: Negative.   Gastrointestinal: Positive for nausea. Negative for abdominal pain, constipation, diarrhea and vomiting.  Musculoskeletal: Positive for neck pain.       Joint pain, elbows and knees hurt  Neurological: Positive for headaches. Negative for dizziness, tremors, seizures, syncope, speech difficulty, weakness, light-headedness and numbness.    Allergies Patient  is allergic to latex.  Past Medical History Patient  has a past medical history of Anxiety, Depression, GERD (gastroesophageal reflux disease), Hyperlipidemia, and Hypertension.  Surgical History Patient  has a past surgical history that includes Cesarean section (2000, 2002, 2005) and Cholecystectomy (2006).  Family History Pateint's family history includes ADD / ADHD in her daughter; Alcohol abuse in her daughter, father, and paternal grandfather; Birth defects in her father; Cancer in her father, paternal grandfather, and paternal grandmother; Depression in her daughter, daughter, mother, paternal grandfather, and son; Drug abuse in her daughter; Early death in her paternal grandmother; Hearing loss in her daughter; Heart attack in her maternal grandmother; Heart disease in her father, maternal grandmother, and mother; Hypertension in her mother; Learning disabilities in her daughter; Marfan syndrome in her father.  Social History Patient  reports that she has never smoked. She has never used smokeless tobacco. She reports that she does not drink alcohol or use drugs.    Objective: Vitals:   02/21/18 1312  BP: 120/82  Pulse: 96  Temp: 98.4 F (36.9 C)  TempSrc: Oral  SpO2: 97%  Weight: 221 lb (100.2 kg)  Height: 5\' 8"  (1.727 m)    Body mass index is 33.6 kg/m.  Physical Exam  Constitutional: She is oriented to person, place, and time. She appears well-developed and well-nourished.  HENT:  Right Ear: External ear normal.  Left Ear: External ear  normal.  Nose: Nose normal.  Mouth/Throat: Oropharynx is clear and moist. No oropharyngeal exudate.  Eyes: Conjunctivae are normal.  Neck: No thyromegaly present.  Cardiovascular: Normal rate, regular rhythm and normal heart sounds.  Pulmonary/Chest: Effort normal and breath sounds normal. She has no wheezes. She has no rales.  Abdominal: Soft. Bowel sounds are normal.  Musculoskeletal:  TTP over bilateral knees, left elbow, neck.  No erythema/edema in joints.   Lymphadenopathy:    She has no cervical adenopathy.  Neurological: She is alert and oriented to person, place, and time. No cranial nerve deficit.  Skin:  Open bites on her legs   Vitals reviewed.      Assessment/plan: 1. Arthralgia, unspecified joint Large differential: oa, ra, fibromylagia, auto immune, tick disease (but already on doxy), drug reaction to doxy, viral illness.  Checking labs. One more pill of doxy and then done. Give this a little bit more time if viral. nsaids prn for join pain. Will f/u with labs. handout given on fibro. Would f/u with PCP for further treatment.  - Comprehensive metabolic panel - C-reactive protein - CBC with Differential/Platelet - TSH - ANA - Rheumatoid factor - Sedimentation rate  2. Fever and chills Afebrile at this time. Viral illness vs. Tick illness. Already about to complete course of doxy. checking above labs. Continue conservative treatment at this time as exam is normal. I am giving her some bactroban to put on bites on legs. If not better in 2-3 days, f/u with pcp. precautions given.       Return if symptoms worsen or fail to improve.     Orma Flaming, MD [place]  02/21/2018

## 2018-02-24 ENCOUNTER — Telehealth: Payer: Self-pay | Admitting: Family Medicine

## 2018-02-24 NOTE — Telephone Encounter (Signed)
Copied from [place] [phone]. Topic: Quick Communication - See Telephone Encounter >> Feb 24, 2018  1:56 PM Gardiner Ramus wrote: CRM for notification. See Telephone encounter for: 02/24/18.pt called and wanted to f/u on lab results from 02/21/18. Pt states that she was till experiencing fevers. She was seen at  [facility] by [facility]. Please advise

## 2018-02-25 LAB — ANTI-NUCLEAR AB-TITER (ANA TITER)

## 2018-02-25 LAB — RHEUMATOID FACTOR: Rhuematoid fact SerPl-aCnc: <14 IU/mL (ref <14)

## 2018-02-25 LAB — ANA: ANA: POSITIVE — AB

## 2018-02-25 NOTE — Telephone Encounter (Signed)
Message sent to Dr. Jordan for review. 

## 2018-02-26 NOTE — Telephone Encounter (Signed)
I have not ordered labs recently. Reviewing records, it seems like labs have been already reviewed by ordering provider and recommendations given. If still having fever she needs to arrange a follow-up appointment.  Thanks, BJ

## 2018-02-28 NOTE — Telephone Encounter (Signed)
Left message for patient to return call to clinic, following up on sx.

## 2018-03-03 NOTE — Telephone Encounter (Signed)
Left detailed message for patient to give clinic a call back if she needed any further assistance. Called patient to follow up on sx from her visit on 02/21/18.

## 2018-04-09 ENCOUNTER — Encounter: Payer: Self-pay | Admitting: Family Medicine

## 2018-04-09 ENCOUNTER — Ambulatory Visit: Payer: BLUE CROSS/BLUE SHIELD | Admitting: Family Medicine

## 2018-04-09 VITALS — BP 124/76 | HR 100 | Temp 97.9°F | Resp 12 | Ht 68.0 in | Wt 226.1 lb

## 2018-04-09 DIAGNOSIS — Z1211 Encounter for screening for malignant neoplasm of colon: Secondary | ICD-10-CM

## 2018-04-09 DIAGNOSIS — K644 Residual hemorrhoidal skin tags: Secondary | ICD-10-CM

## 2018-04-09 DIAGNOSIS — L509 Urticaria, unspecified: Secondary | ICD-10-CM | POA: Diagnosis not present

## 2018-04-09 DIAGNOSIS — M2550 Pain in unspecified joint: Secondary | ICD-10-CM

## 2018-04-09 DIAGNOSIS — I10 Essential (primary) hypertension: Secondary | ICD-10-CM | POA: Diagnosis not present

## 2018-04-09 DIAGNOSIS — E559 Vitamin D deficiency, unspecified: Secondary | ICD-10-CM | POA: Insufficient documentation

## 2018-04-09 MED ORDER — HYDROCORTISONE 2.5 % RE CREA: 1.0000 "application " | TOPICAL_CREAM | Freq: Two times a day (BID) | RECTAL | 0 refills | Status: AC

## 2018-04-09 NOTE — Patient Instructions (Addendum)
A few things to remember from today's visit:   Hypertension, essential, benign  Vitamin D deficiency, unspecified  Polyarthralgia - Plan: ANA, C-reactive protein  Urticaria - Plan: Ambulatory referral to Immunology  Colon cancer screening - Plan: Ambulatory referral to Gastroenterology  External hemorrhoids without complication   Hemorrhoids Hemorrhoids are swollen veins in and around the rectum or anus. Hemorrhoids can cause pain, itching, or bleeding. Most of the time, they do not cause serious problems. They usually get better with diet changes, lifestyle changes, and other home treatments. Follow these instructions at home: Eating and drinking  Eat foods that have fiber, such as whole grains, beans, nuts, fruits, and vegetables. Ask your doctor about taking products that have added fiber (fibersupplements).  Drink enough fluid to keep your pee (urine) clear or pale yellow. For Pain and Swelling  Take a warm-water bath (sitz bath) for 20 minutes to ease pain. Do this 3-4 times a day.  If directed, put ice on the painful area. It may be helpful to use ice between your warm baths. ? Put ice in a plastic bag. ? Place a towel between your skin and the bag. ? Leave the ice on for 20 minutes, 2-3 times a day. General instructions  Take over-the-counter and prescription medicines only as told by your doctor. ? Medicated creams and medicines that are inserted into the anus (suppositories) may be used or applied as told.  Exercise often.  Go to the bathroom when you have the urge to poop (to have a bowel movement). Do not wait.  Avoid pushing too hard (straining) when you poop.  Keep the butt area dry and clean. Use wet toilet paper or moist paper towels.  Do not sit on the toilet for a long time. Contact a doctor if:  You have any of these: ? Pain and swelling that do not get better with treatment or medicine. ? Bleeding that will not stop. ? Trouble pooping or you  cannot poop. ? Pain or swelling outside the area of the hemorrhoids. This information is not intended to replace advice given to you by your health care provider. Make sure you discuss any questions you have with your health care provider. Document Released: 05/22/2008 Document Revised: 01/19/2016 Document Reviewed: 04/27/2015 Elsevier Interactive Patient Education  2018 [employer].  Please be sure medication list is accurate. If a new problem present, please set up appointment sooner than planned today.

## 2018-04-09 NOTE — Progress Notes (Signed)
HPI:   Angela Everett is a 52 y.o. female, who is here today for follow up on recent acute visit and with multiple concerns.  I saw her last on 02/14/18 because LE skin lesion with surrounding erythema, treated with Doxycycline. After visit when she was on her 6th day of abx she developed fever on 102 and 101,myalgias,and worsening arthralgias.  Knees,elbows, hand IP joint pain. No edema or erythema.  Lab work was done 02/21/18, otherwise normal except for mildly elevated ESR,low WBC's, and positive ANA's with heterogeneous pattern 1:80  CRP in normal range.  Still having some achy pain but not as bed as it was. She states that joint pain could be related to overuse.   Hypertension:   Currently on HCTZ 12.5 mg and Avapro 300 mg daily.  She is taking medications as instructed, no side effects reported.  She has not noted unusual headache, visual changes, exertional chest pain, dyspnea,  focal weakness, or edema.   Lab Results  Component Value Date   CREATININE 0.84 02/21/2018   BUN 11 02/21/2018   NA 139 02/21/2018   K 3.8 02/21/2018   CL 102 02/21/2018   CO2 28 02/21/2018    Vitamin D deficiency: She is currently on 5000 units of vitamin D.   -She is vomiting every time she eats corn. Has happened 4 times total this summer. She thinks she should see an immunologists.  Hx hives on and off,she has been hospitalized for skin rash.  Dermatologist evaluation in the past and has been told she has "sensitive" skin.    Hemorrhoids that "do not go away.". Hx of intermittent dyschezia/hemorrhoids that started with pregnancies. It has been painful for at least a months.   She usually has several soft stools that seem to be related with stress. She has been under a lot of stress for the past couple months.   Number of stools per day varies from 2 to 5 with urgency. No blood in stool. +Bloating sensation.   Also c/o hot flashes.  LMP about 3  months ago.  She is not sexually active.    Review of Systems  Constitutional: Positive for fatigue. Negative for activity change, appetite change, fever and unexpected weight change.  HENT: Negative for mouth sores, nosebleeds and trouble swallowing.   Eyes: Negative for redness and visual disturbance.  Respiratory: Negative for cough, shortness of breath and wheezing.   Cardiovascular: Negative for chest pain, palpitations and leg swelling.  Gastrointestinal: Positive for diarrhea, nausea and vomiting. Negative for abdominal pain.       Negative for changes in bowel habits.  Genitourinary: Negative for decreased urine volume, dysuria and hematuria.  Musculoskeletal: Positive for arthralgias. Negative for gait problem and joint swelling.  Skin: Negative for wound.  Neurological: Negative for syncope, weakness and headaches.  Psychiatric/Behavioral: Negative for confusion. The patient is nervous/anxious.      Current Outpatient Medications on File Prior to Visit  Medication Sig Dispense Refill  . cetirizine (ZYRTEC) 10 MG tablet Take 1 tablet (10 mg total) by mouth daily. 30 tablet 0  . hydrochlorothiazide (HYDRODIURIL) 12.5 MG tablet Take 1 tablet (12.5 mg total) by mouth daily. 90 tablet 2  . irbesartan (AVAPRO) 300 MG tablet Take 1 tablet (300 mg total) by mouth daily. 90 tablet 2   No current facility-administered medications on file prior to visit.      Past Medical History:  Diagnosis Date  . Anxiety   . Depression   .  GERD (gastroesophageal reflux disease)   . Hyperlipidemia   . Hypertension    Allergies  Allergen Reactions  . Latex   . Corn-Containing Products Nausea And Vomiting    Social History   Socioeconomic History  . Marital status: Legally Separated    Spouse name: Not on file  . Number of children: Not on file  . Years of education: Not on file  . Highest education level: Not on file  Occupational History  . Not on file  Social Needs  .  Financial resource strain: Not on file  . Food insecurity:    Worry: Not on file    Inability: Not on file  . Transportation needs:    Medical: Not on file    Non-medical: Not on file  Tobacco Use  . Smoking status: Never Smoker  . Smokeless tobacco: Never Used  Substance and Sexual Activity  . Alcohol use: No    Frequency: Never  . Drug use: No  . Sexual activity: Never  Lifestyle  . Physical activity:    Days per week: 0 days    Minutes per session: 0 min  . Stress: Not on file  Relationships  . Social connections:    Talks on phone: Not on file    Gets together: Not on file    Attends religious service: Not on file    Active member of club or organization: Not on file    Attends meetings of clubs or organizations: Not on file    Relationship status: Not on file  Other Topics Concern  . Not on file  Social History Narrative  . Not on file    Vitals:   04/09/18 1546  BP: 124/76  Pulse: 100  Resp: 12  Temp: 97.9 F (36.6 C)  SpO2: 98%   Body mass index is 34.38 kg/m.   Physical Exam  Nursing note and vitals reviewed. Constitutional: She is oriented to person, place, and time. She appears well-developed. No distress.  HENT:  Head: Normocephalic and atraumatic.  Mouth/Throat: Oropharynx is clear and moist and mucous membranes are normal.  Eyes: Pupils are equal, round, and reactive to light. Conjunctivae are normal.  Cardiovascular: Normal rate and regular rhythm.  No murmur heard. Pulses:      Dorsalis pedis pulses are 2+ on the right side, and 2+ on the left side.  Respiratory: Effort normal and breath sounds normal. No respiratory distress.  GI: Soft. She exhibits no mass. There is no hepatomegaly. There is no tenderness.  Genitourinary: Rectal exam shows no fissure, no mass and no tenderness.  Genitourinary Comments: Skin tags perianal, no external hemorrhoid upon inspection.Some palpated in anal canal.    Musculoskeletal: She exhibits no edema.  No  signs of synovitis, no major deformities of IP joints. No limitation of movement of shoulders. Knee crepitus bilateral,no pain elicited.  Lymphadenopathy:    She has no cervical adenopathy.  Neurological: She is alert and oriented to person, place, and time. She has normal strength. No cranial nerve deficit. Gait normal.  Skin: Skin is warm. No rash noted. No erythema.  Psychiatric: Her mood appears anxious.  Well groomed, good eye contact.       ASSESSMENT AND PLAN:   Angela Everett was seen today for follow-up.  Orders Placed This Encounter  Procedures  . ANA  . C-reactive protein  . Ambulatory referral to Immunology  . Ambulatory referral to Gastroenterology    1. Polyarthralgia  Possible etiologies discussed, ? OA  vs rheumatologic disorder. We will repeat ANA's and further recommendations will be given accordingly.  - ANA - C-reactive protein  2. Hypertension, essential, benign  Adequately controlled. No changes in current management. Continue low salt diet. Eye exam annually. F/U in 6 months, before if needed.  3. Urticaria  No rash appreciated today. She has some post inflammatory pigmentation changes on LE's.  - Ambulatory referral to Immunology  4. Colon cancer screening  - Ambulatory referral to Gastroenterology  5. External hemorrhoids without complication  Improving. Topical steroids recommended for 7 days. Sitz bath a few times per week. Adequate fiber intake.  - hydrocortisone (ANUSOL-HC) 2.5 % rectal cream; Place 1 application rectally 2 (two) times daily for 7 days.  Dispense: 30 g; Refill: 0     Yazen Rosko G. Martinique, MD  [facility]. [place] office.

## 2018-04-10 ENCOUNTER — Encounter: Payer: Self-pay | Admitting: Family Medicine

## 2018-04-10 LAB — C-REACTIVE PROTEIN: CRP: 0.8 mg/dL (ref 0.5–20.0)

## 2018-04-11 ENCOUNTER — Other Ambulatory Visit: Payer: Self-pay | Admitting: Family Medicine

## 2018-04-11 ENCOUNTER — Encounter: Payer: Self-pay | Admitting: Family Medicine

## 2018-04-11 DIAGNOSIS — R7689 Other specified abnormal immunological findings in serum: Secondary | ICD-10-CM | POA: Insufficient documentation

## 2018-04-11 DIAGNOSIS — R768 Other specified abnormal immunological findings in serum: Secondary | ICD-10-CM

## 2018-04-11 DIAGNOSIS — M2550 Pain in unspecified joint: Secondary | ICD-10-CM | POA: Insufficient documentation

## 2018-04-11 LAB — ANA: ANA: POSITIVE — AB

## 2018-04-11 LAB — ANTI-NUCLEAR AB-TITER (ANA TITER)

## 2018-04-25 ENCOUNTER — Encounter: Payer: Self-pay | Admitting: Family Medicine

## 2018-05-02 NOTE — Progress Notes (Signed)
Office Visit Note  Patient: Angela Everett             Date of Birth: 1966/07/30           MRN: [id number]             PCP: Martinique, Betty G, MD Referring: Martinique, Betty G, MD Visit Date: 05/12/2018 Occupation: [employer]  Subjective:  Positive ANA and joint pain.   History of Present Illness: Angela Everett is a 52 y.o. female seen in consultation per request of her PCP.  According to patient she moved to [place] about 1 year ago.  She has been under a lot of stress due to family reasons.  She states this summer she was working in her yard and developed severe case of poison oak which was treated with medications and antibiotics.  She states while she was recovering from the poison oak reaction she developed cellulitis on her right ankle for which she required doxycycline.  While she was about to finish doxycycline she developed fever and increased joint pain.  She describes pain in her bilateral feet, bilateral knees and her both elbows.  She is also noticed pain in her bilateral hands.  She states that the time she was seen by her PCP and her ANA came positive.  Her test for rheumatoid factor was negative.  The ANA test was repeated 8 weeks later which was a still positive for that reason she was referred to me.  She states she has had feet pain for the last 1-1/2 years and has difficulty walking at times.  She also notices some stiffness in her hands.  She gives history of recurrent rash for the last 9 years.  She states they mostly hives and they come once a month.  She is seen dermatologist for evaluation in the past.  She states the hives are related to stress.  She also had evaluation by an allergist in the past.  Activities of Daily Living:  Patient reports morning stiffness for 2 hours.   Patient Denies nocturnal pain.  Difficulty dressing/grooming: Denies Difficulty climbing stairs: Denies Difficulty getting out of chair: Denies Difficulty using hands for taps,  buttons, cutlery, and/or writing: Denies  Review of Systems  Constitutional: Positive for fatigue. Negative for night sweats, weight gain and weight loss.  HENT: Positive for mouth dryness. Negative for mouth sores, trouble swallowing, trouble swallowing and nose dryness.   Eyes: Positive for dryness. Negative for pain, redness and visual disturbance.  Respiratory: Negative for cough, shortness of breath and difficulty breathing.   Cardiovascular: Negative for chest pain, palpitations, hypertension, irregular heartbeat and swelling in legs/feet.  Gastrointestinal: Positive for constipation and diarrhea. Negative for blood in stool.  Endocrine: Negative for increased urination.  Genitourinary: Negative for vaginal dryness.  Musculoskeletal: Positive for arthralgias, joint pain and morning stiffness. Negative for joint swelling, myalgias, muscle weakness, muscle tenderness and myalgias.  Skin: Positive for color change, rash, hair loss and sensitivity to sunlight. Negative for skin tightness and ulcers.  Allergic/Immunologic: Negative for susceptible to infections.  Neurological: Negative for dizziness, memory loss, night sweats and weakness.  Hematological: Negative for swollen glands.  Psychiatric/Behavioral: Positive for depressed mood. Negative for sleep disturbance. The patient is nervous/anxious.     PMFS History:  Patient Active Problem List   Diagnosis Date Noted  . Polyarthralgia 04/11/2018  . Positive ANA (antinuclear antibody) 04/11/2018  . Vitamin D deficiency, unspecified 04/09/2018  . Hypertension, essential, benign 08/09/2017  . Hyperlipidemia 08/09/2017  Past Medical History:  Diagnosis Date  . Anxiety   . Depression   . GERD (gastroesophageal reflux disease)   . Hyperlipidemia   . Hypertension     Family History  Problem Relation Age of Onset  . Depression Mother   . Heart disease Mother   . Hypertension Mother   . Birth defects Father   . Cancer Father   .  Heart disease Father   . Marfan syndrome Father   . ADD / ADHD Daughter   . Alcohol abuse Daughter   . Depression Daughter   . Drug abuse Daughter   . Learning disabilities Daughter   . Allergy (severe) Daughter   . Depression Son   . Allergy (severe) Son   . Heart disease Maternal Grandmother   . Heart attack Maternal Grandmother   . Cancer Paternal Grandmother   . Early death Paternal Grandmother   . Alcohol abuse Paternal Grandfather   . Cancer Paternal Grandfather   . Depression Paternal Grandfather   . Depression Daughter   . Hearing loss Daughter   . Celiac disease Daughter    Past Surgical History:  Procedure Laterality Date  . CERVICAL SPINE SURGERY  2011  . CESAREAN SECTION  2000, 2002, 2005  . CHOLECYSTECTOMY  2006   Social History   Social History Narrative  . Not on file    Objective: Vital Signs: BP (!) 150/95 (BP Location: Right Arm, Patient Position: Sitting, Cuff Size: Normal)   Pulse 73   Resp 14   Ht 5\' 8"  (1.727 m)   Wt 222 lb 3.2 oz (100.8 kg)   BMI 33.79 kg/m    Physical Exam  Constitutional: She is oriented to person, place, and time. She appears well-developed and well-nourished.  HENT:  Head: Normocephalic and atraumatic.  Eyes: Conjunctivae and EOM are normal.  Neck: Normal range of motion.  Cardiovascular: Normal rate, regular rhythm, normal heart sounds and intact distal pulses.  Pulmonary/Chest: Effort normal and breath sounds normal.  Abdominal: Soft. Bowel sounds are normal.  Lymphadenopathy:    She has no cervical adenopathy.  Neurological: She is alert and oriented to person, place, and time.  Skin: Skin is warm and dry. Capillary refill takes less than 2 seconds.  Psychiatric: She has a normal mood and affect. Her behavior is normal.  Nursing note and vitals reviewed.    Musculoskeletal Exam: C-spine thoracic lumbar spine good range of motion.  Shoulder joints elbow joints wrist joints are good range of motion.  She has PIP  and DIP thickening in her hands consistent with osteoarthritis.  No synovitis was noted.  She has some discomfort range of motion of her left hip joint.  She has tenderness over bilateral trochanteric bursa.  She had no warmth swelling or effusion in her knee joints but had discomfort range of motion of her knee joints.  She has DIP PIP and first MTP thickening consistent with osteoarthritis.  No synovitis was noted.  CDAI Exam: CDAI Score: Not documented Patient Global Assessment: Not documented; Provider Global Assessment: Not documented Swollen: Not documented; Tender: Not documented Joint Exam   Not documented   There is currently no information documented on the homunculus. Go to the Rheumatology activity and complete the homunculus joint exam.  Investigation: No additional findings. Component     Latest Ref Rng & Units 02/21/2018 04/09/2018  ANA Pattern 1      HOMOGENEOUS (A) HOMOGENEOUS (A)  ANA Titer 1     titer 1:80 (H)  1:80 (H)  TSH     0.35 - 4.50 uIU/mL 0.75   Anti Nuclear Antibody(ANA)     NEGATIVE POSITIVE (A) POSITIVE (A)  RA Latex Turbid.     <14 IU/mL <14   Sed Rate     0 - 30 mm/hr 33 (H)   CRP     0.5 - 20.0 mg/dL  0.8   Imaging: Xr Foot 2 Views Left  Result Date: 05/12/2018 First MTP, PIP and DIP narrowing was noted.  No erosive changes were noted.  No intertarsal joint space narrowing was noted.  Inferior and posterior calcaneal spurs were noted. Impression: These findings are consistent with osteoarthritis of the foot.  Xr Foot 2 Views Right  Result Date: 05/12/2018 First MTP, PIP and DIP narrowing was noted.  No erosive changes were noted.  No intertarsal joint space narrowing was noted.  Inferior and posterior calcaneal spurs were noted. Impression: These findings are consistent with osteoarthritis of the foot.  Xr Hand 2 View Left  Result Date: 05/12/2018 PIP DIP and CMC narrowing was noted.  No MCP or intercarpal joint space narrowing was noted.  Impression: These findings are consistent with osteoarthritis of the hand.  Xr Hand 2 View Right  Result Date: 05/12/2018 PIP DIP and CMC narrowing was noted.  No MCP or intercarpal joint space narrowing was noted. Impression: These findings are consistent with osteoarthritis of the hand.  Xr Knee 3 View Left  Result Date: 05/12/2018 Moderate medial compartment narrowing was noted.  Intercondylar osteophytes were noted.  No chondrocalcinosis was noted.  Severe patellofemoral narrowing was noted. Impression: These findings are consistent with moderate osteoarthritis and severe chondromalacia patella.  Xr Knee 3 View Right  Result Date: 05/12/2018 Moderate medial compartment narrowing was noted.  Intercondylar osteophytes were noted.  No chondrocalcinosis was noted.  Severe patellofemoral narrowing was noted. Impression: These findings are consistent with moderate osteoarthritis and severe chondromalacia patella.   Recent Labs: Lab Results  Component Value Date   WBC 2.5 (L) 02/21/2018   HGB 14.1 02/21/2018   PLT 253.0 02/21/2018   NA 139 02/21/2018   K 3.8 02/21/2018   CL 102 02/21/2018   CO2 28 02/21/2018   GLUCOSE 96 02/21/2018   BUN 11 02/21/2018   CREATININE 0.84 02/21/2018   BILITOT 0.4 02/21/2018   ALKPHOS 59 02/21/2018   AST 22 02/21/2018   ALT 16 02/21/2018   PROT 7.3 02/21/2018   ALBUMIN 4.2 02/21/2018   CALCIUM 9.3 02/21/2018    Speciality Comments: No specialty comments available.  Procedures:  No procedures performed Allergies: Latex and Corn-containing products   Assessment / Plan:     Visit Diagnoses: Positive ANA (antinuclear antibody) -patient had low titer positive ANA x2 after taking course of doxycycline.  I did discuss that doxycycline sometimes can induce positive ANA.  She gives history of sicca symptoms and she does have neutropenia.  I will obtain AVISE on her.  02/21/18: ANA 1:80 Homogeneous, RF<14, Sed rate 33, CRP 0.8, TSH 0.75 - Plan: Urinalysis,  Routine w reflex microscopic, Lupus Anticoagulant Eval w/Reflex  Pain in both hands -she complains of pain and discomfort in bilateral hands.  No synovitis was noted.  Clinical findings are consistent with osteoarthritis.  I will obtain following labs today.  Plan: XR Hand 2 View Right, XR Hand 2 View Left,.  The x-rays were consistent with osteoarthritis.  Uric acid, Sedimentation rate  Chronic pain of both knees -she has been having increased discomfort in her knee  joints and difficulty getting up from the chair.  No warmth swelling or effusion was noted.  Plan: XR KNEE 3 VIEW RIGHT, XR KNEE 3 VIEW LEFT.  The x-rays showed moderate osteoarthritis and severe chondromalacia patella.  Pain in both feet -she gives history of discomfort in her bilateral feet for few years.  She does have osteoarthritic changes on clinical examination.  Plan: XR Foot 2 Views Right, XR Foot 2 Views Left.  The x-rays show osteoarthritic changes in bilateral feet.  Myofascial pain-she has positive tender points and generalized pain which raises concern about myofascial pain.  Rash-she gives history of recurrent hives for which she is seen dermatologist and allergist in the past.  She believes her hives are related to his stress.  She has been under a lot of stress due to her family situation.  Vitamin D deficiency, unspecified -her vitamin D was 17 last year I do not see a repeat level.  She has been experiencing fatigue.  I will recheck her level today.  Plan: VITAMIN D 25 Hydroxy (Vit-D Deficiency, Fractures)  Hypertension, essential, benign-her blood pressure is a still elevated.  Have advised her to monitor blood pressure closely and follow-up with her PCP.  History of hyperlipidemia  Anxiety and depression  Other neutropenia ([place]) - Plan: CBC with Differential/Platelet  Other fatigue - Plan: CK, Serum protein electrophoresis with reflex, IgG, IgA, IgM   Orders: Orders Placed This Encounter  Procedures  . XR  Hand 2 View Right  . XR Hand 2 View Left  . XR KNEE 3 VIEW RIGHT  . XR KNEE 3 VIEW LEFT  . XR Foot 2 Views Right  . XR Foot 2 Views Left  . CBC with Differential/Platelet  . Urinalysis, Routine w reflex microscopic  . CK  . Uric acid  . Sedimentation rate  . Lupus Anticoagulant Eval w/Reflex  . Serum protein electrophoresis with reflex  . IgG, IgA, IgM  . VITAMIN D 25 Hydroxy (Vit-D Deficiency, Fractures)   No orders of the defined types were placed in this encounter.   Face-to-face time spent with patient was 50 minutes. Greater than 50% of time was spent in counseling and coordination of care.  Follow-Up Instructions: Return for Polyarthralgia, +ANA.   Bo Merino, MD  Note - This record has been created using Editor, commissioning.  Chart creation errors have been sought, but may not always  have been located. Such creation errors do not reflect on  the standard of medical care.

## 2018-05-08 ENCOUNTER — Encounter: Payer: Self-pay | Admitting: Family Medicine

## 2018-05-12 ENCOUNTER — Ambulatory Visit (INDEPENDENT_AMBULATORY_CARE_PROVIDER_SITE_OTHER): Payer: Self-pay

## 2018-05-12 ENCOUNTER — Ambulatory Visit: Payer: BLUE CROSS/BLUE SHIELD | Admitting: Rheumatology

## 2018-05-12 ENCOUNTER — Encounter: Payer: Self-pay | Admitting: Rheumatology

## 2018-05-12 VITALS — BP 150/95 | HR 73 | Resp 14 | Ht 68.0 in | Wt 222.2 lb

## 2018-05-12 DIAGNOSIS — M25561 Pain in right knee: Secondary | ICD-10-CM | POA: Diagnosis not present

## 2018-05-12 DIAGNOSIS — R5383 Other fatigue: Secondary | ICD-10-CM

## 2018-05-12 DIAGNOSIS — E559 Vitamin D deficiency, unspecified: Secondary | ICD-10-CM

## 2018-05-12 DIAGNOSIS — R768 Other specified abnormal immunological findings in serum: Secondary | ICD-10-CM

## 2018-05-12 DIAGNOSIS — M79671 Pain in right foot: Secondary | ICD-10-CM | POA: Diagnosis not present

## 2018-05-12 DIAGNOSIS — D708 Other neutropenia: Secondary | ICD-10-CM | POA: Diagnosis not present

## 2018-05-12 DIAGNOSIS — F329 Major depressive disorder, single episode, unspecified: Secondary | ICD-10-CM

## 2018-05-12 DIAGNOSIS — I10 Essential (primary) hypertension: Secondary | ICD-10-CM

## 2018-05-12 DIAGNOSIS — F32A Depression, unspecified: Secondary | ICD-10-CM

## 2018-05-12 DIAGNOSIS — F419 Anxiety disorder, unspecified: Secondary | ICD-10-CM

## 2018-05-12 DIAGNOSIS — M79642 Pain in left hand: Secondary | ICD-10-CM | POA: Diagnosis not present

## 2018-05-12 DIAGNOSIS — M25562 Pain in left knee: Secondary | ICD-10-CM | POA: Diagnosis not present

## 2018-05-12 DIAGNOSIS — G8929 Other chronic pain: Secondary | ICD-10-CM

## 2018-05-12 DIAGNOSIS — M79672 Pain in left foot: Secondary | ICD-10-CM | POA: Diagnosis not present

## 2018-05-12 DIAGNOSIS — R21 Rash and other nonspecific skin eruption: Secondary | ICD-10-CM

## 2018-05-12 DIAGNOSIS — M7918 Myalgia, other site: Secondary | ICD-10-CM

## 2018-05-12 DIAGNOSIS — M79641 Pain in right hand: Secondary | ICD-10-CM

## 2018-05-12 DIAGNOSIS — Z8639 Personal history of other endocrine, nutritional and metabolic disease: Secondary | ICD-10-CM

## 2018-05-12 NOTE — Patient Instructions (Signed)

## 2018-05-15 LAB — PROTEIN ELECTROPHORESIS, SERUM, WITH REFLEX
ALBUMIN ELP: 4 g/dL (ref 3.8–4.8)
ALPHA 1: 0.3 g/dL (ref 0.2–0.3)
ALPHA 2: 0.7 g/dL (ref 0.5–0.9)
BETA 2: 0.3 g/dL (ref 0.2–0.5)
BETA GLOBULIN: 0.5 g/dL (ref 0.4–0.6)
Gamma Globulin: 1 g/dL (ref 0.8–1.7)
Total Protein: 6.8 g/dL (ref 6.1–8.1)

## 2018-05-15 LAB — LUPUS ANTICOAGULANT EVAL W/ REFLEX
PTT-LA Screen: 33 s (ref <=40)
dRVVT: 39 s (ref <=45)

## 2018-05-15 LAB — IGG, IGA, IGM
IGG (IMMUNOGLOBIN G), SERUM: 1087 mg/dL (ref 600–1640)
IGM, SERUM: 58 mg/dL (ref 50–300)
Immunoglobulin A: 187 mg/dL (ref 47–310)

## 2018-05-15 LAB — CBC WITH DIFFERENTIAL/PLATELET
Basophils Absolute: 41 cells/uL (ref 0–200)
Basophils Relative: 0.8 %
Eosinophils Absolute: 148 cells/uL (ref 15–500)
Eosinophils Relative: 2.9 %
HCT: 38.5 % (ref 35.0–45.0)
Hemoglobin: 13.2 g/dL (ref 11.7–15.5)
Lymphs Abs: 1770 cells/uL (ref 850–3900)
MCH: 30.4 pg (ref 27.0–33.0)
MCHC: 34.3 g/dL (ref 32.0–36.0)
MCV: 88.7 fL (ref 80.0–100.0)
MPV: 10.3 fL (ref 7.5–12.5)
Monocytes Relative: 5.9 %
Neutro Abs: 2841 cells/uL (ref 1500–7800)
Neutrophils Relative %: 55.7 %
Platelets: 329 10*3/uL (ref 140–400)
RBC: 4.34 10*6/uL (ref 3.80–5.10)
RDW: 12.7 % (ref 11.0–15.0)
Total Lymphocyte: 34.7 %
WBC mixed population: 301 cells/uL (ref 200–950)
WBC: 5.1 10*3/uL (ref 3.8–10.8)

## 2018-05-15 LAB — URINALYSIS, ROUTINE W REFLEX MICROSCOPIC
BILIRUBIN URINE: NEGATIVE
GLUCOSE, UA: NEGATIVE
Hgb urine dipstick: NEGATIVE
Ketones, ur: NEGATIVE
LEUKOCYTES UA: NEGATIVE
Nitrite: NEGATIVE
Protein, ur: NEGATIVE
SPECIFIC GRAVITY, URINE: 1.033 (ref 1.001–1.03)
pH: 5.5 (ref 5.0–8.0)

## 2018-05-15 LAB — VITAMIN D 25 HYDROXY (VIT D DEFICIENCY, FRACTURES): Vit D, 25-Hydroxy: 21 ng/mL — ABNORMAL LOW (ref 30–100)

## 2018-05-15 LAB — CK: CK TOTAL: 57 U/L (ref 29–143)

## 2018-05-15 LAB — URIC ACID: Uric Acid, Serum: 5.9 mg/dL (ref 2.5–7.0)

## 2018-05-15 LAB — SEDIMENTATION RATE: SED RATE: 11 mm/h (ref 0–30)

## 2018-05-16 NOTE — Progress Notes (Signed)
Vitamin D 50,000 units twice a week for 3 months.  Recheck vitamin D level in 3 months.

## 2018-05-21 ENCOUNTER — Telehealth: Payer: Self-pay | Admitting: *Deleted

## 2018-05-21 DIAGNOSIS — M2550 Pain in unspecified joint: Secondary | ICD-10-CM | POA: Diagnosis not present

## 2018-05-21 DIAGNOSIS — E559 Vitamin D deficiency, unspecified: Secondary | ICD-10-CM

## 2018-05-21 DIAGNOSIS — R21 Rash and other nonspecific skin eruption: Secondary | ICD-10-CM | POA: Diagnosis not present

## 2018-05-21 DIAGNOSIS — R768 Other specified abnormal immunological findings in serum: Secondary | ICD-10-CM | POA: Diagnosis not present

## 2018-05-21 DIAGNOSIS — M7918 Myalgia, other site: Secondary | ICD-10-CM | POA: Diagnosis not present

## 2018-05-21 MED ORDER — VITAMIN D (ERGOCALCIFEROL) 1.25 MG (50000 UNIT) PO CAPS: 50000.0000 [IU] | ORAL_CAPSULE | ORAL | 0 refills | Status: DC

## 2018-05-21 NOTE — Telephone Encounter (Signed)
-----   Message from Bo Merino, MD sent at 05/16/2018  2:22 PM EDT ----- Vitamin D 50,000 units twice a week for 3 months.  Recheck vitamin D level in 3 months.

## 2018-05-23 DIAGNOSIS — Z23 Encounter for immunization: Secondary | ICD-10-CM | POA: Diagnosis not present

## 2018-06-12 ENCOUNTER — Other Ambulatory Visit: Payer: Self-pay | Admitting: Rheumatology

## 2018-06-16 ENCOUNTER — Ambulatory Visit: Payer: BLUE CROSS/BLUE SHIELD | Admitting: Gastroenterology

## 2018-06-16 ENCOUNTER — Encounter: Payer: Self-pay | Admitting: Gastroenterology

## 2018-06-16 ENCOUNTER — Other Ambulatory Visit: Payer: BLUE CROSS/BLUE SHIELD

## 2018-06-16 VITALS — BP 110/76 | HR 96 | Ht 67.72 in | Wt 215.2 lb

## 2018-06-16 DIAGNOSIS — Z800 Family history of malignant neoplasm of digestive organs: Secondary | ICD-10-CM

## 2018-06-16 DIAGNOSIS — R140 Abdominal distension (gaseous): Secondary | ICD-10-CM

## 2018-06-16 DIAGNOSIS — Z8379 Family history of other diseases of the digestive system: Secondary | ICD-10-CM

## 2018-06-16 DIAGNOSIS — Z1211 Encounter for screening for malignant neoplasm of colon: Secondary | ICD-10-CM

## 2018-06-16 DIAGNOSIS — R1310 Dysphagia, unspecified: Secondary | ICD-10-CM

## 2018-06-16 DIAGNOSIS — K219 Gastro-esophageal reflux disease without esophagitis: Secondary | ICD-10-CM | POA: Diagnosis not present

## 2018-06-16 DIAGNOSIS — K582 Mixed irritable bowel syndrome: Secondary | ICD-10-CM

## 2018-06-16 MED ORDER — NA SULFATE-K SULFATE-MG SULF 17.5-3.13-1.6 GM/177ML PO SOLN: 1.0000 | Freq: Once | ORAL | 0 refills | Status: AC

## 2018-06-16 NOTE — Progress Notes (Signed)
Angela Everett    [id number]    [birth year]  Primary Care Physician:Jordan, Malka So, MD  Referring Physician: Martinique, Betty G, MD [place], [place] [address]  Chief complaint: Dysphagia, GERD, colorectal cancer screening HPI: 52 year old female with history of chronic GERD with complaints of worsening dysphagia.  She moved here from [place] last year.  Status post cholecystectomy.  History of hypertension, hyperlipidemia.  She has been having generalized myalgia and arthralgia.  Had elevated ANA and is being worked up by rheumatology  Dysphagia: Worse with solid food. Rice , bagels.  Food gets stuck in the throat and has tightening sensation.  She had to reguritate it back, happens once every couple of months.   GERD: heartburn during pregnancy pretty bad.  Since then she has been having intermittent heartburn and on some days wakes up with hoarse throat, some heartburn.   Diarrhea: some days worse. About 2 BM on most days. Some days loose bowel movement alternating with no BM for 1 to 2 days.  Diarrhea is worse with corn.  He also had vomiting after eating corn chips this week  Her youngest daughter has celiac. Son has EOE to dairy Never tested for Gluten sensitivity or celiac No family history of colon cancer  Grandfather's side of family had gastric cancer's  Outpatient Encounter Medications as of 06/16/2018  Medication Sig  . cetirizine (ZYRTEC) 10 MG tablet Take 1 tablet (10 mg total) by mouth daily.  . hydrochlorothiazide (HYDRODIURIL) 12.5 MG tablet Take 1 tablet (12.5 mg total) by mouth daily.  . irbesartan (AVAPRO) 300 MG tablet Take 1 tablet (300 mg total) by mouth daily.  . Multiple Vitamins-Minerals (MULTIVITAMIN ADULT PO) Take by mouth daily.  Marland Kitchen OLIVE LEAF PO Take by mouth daily.  Marland Kitchen OVER THE COUNTER MEDICATION   . Vitamin D, Ergocalciferol, (DRISDOL) 50000 units CAPS capsule Take 1 capsule (50,000 Units total) by mouth 2  (two) times a week.   No facility-administered encounter medications on file as of 06/16/2018.     Allergies as of 06/16/2018 - Review Complete 06/16/2018  Allergen Reaction Noted  . Latex  08/09/2017  . Corn-containing products Nausea And Vomiting 04/09/2018    Past Medical History:  Diagnosis Date  . Anxiety   . Depression   . Gallstones   . GERD (gastroesophageal reflux disease)   . Hyperlipidemia   . Hypertension     Past Surgical History:  Procedure Laterality Date  . CERVICAL SPINE SURGERY  2011  . CESAREAN SECTION  2000, 2002, 2005   x 3  . CHOLECYSTECTOMY  2006    Family History  Problem Relation Age of Onset  . Depression Mother   . Heart disease Mother   . Hypertension Mother   . Birth defects Father   . Cancer Father        mets  . Heart disease Father   . Marfan syndrome Father   . ADD / ADHD Daughter   . Alcohol abuse Daughter   . Depression Daughter   . Drug abuse Daughter   . Learning disabilities Daughter   . Allergy (severe) Daughter   . Depression Son   . Allergy (severe) Son        Eosinophilic esophagitis  . Heart disease Maternal Grandmother   . Heart attack Maternal Grandmother   . Cancer Paternal Grandmother        brain tumor  . Early death Paternal Grandmother   .  Alcohol abuse Paternal Grandfather   . Depression Paternal Grandfather   . Bladder Cancer Paternal Grandfather   . Depression Daughter   . Hearing loss Daughter   . Celiac disease Daughter     Social History   Socioeconomic History  . Marital status: Legally Separated    Spouse name: Not on file  . Number of children: 3  . Years of education: Not on file  . Highest education level: Not on file  Occupational History  . Occupation: nanny  Social Needs  . Financial resource strain: Not on file  . Food insecurity:    Worry: Not on file    Inability: Not on file  . Transportation needs:    Medical: Not on file    Non-medical: Not on file  Tobacco Use  .  Smoking status: Former Smoker    Types: Cigarettes    Last attempt to quit: 1999    Years since quitting: 20.8  . Smokeless tobacco: Never Used  Substance and Sexual Activity  . Alcohol use: Yes    Frequency: Never    Comment: occ  . Drug use: No  . Sexual activity: Never  Lifestyle  . Physical activity:    Days per week: 0 days    Minutes per session: 0 min  . Stress: Not on file  Relationships  . Social connections:    Talks on phone: Not on file    Gets together: Not on file    Attends religious service: Not on file    Active member of club or organization: Not on file    Attends meetings of clubs or organizations: Not on file    Relationship status: Not on file  . Intimate partner violence:    Fear of current or ex partner: Not on file    Emotionally abused: Not on file    Physically abused: Not on file    Forced sexual activity: Not on file  Other Topics Concern  . Not on file  Social History Narrative  . Not on file      Review of systems: Review of Systems  Constitutional: Negative for fever and chills.  Positive for fatigue HENT: Positive for sinus trouble Eyes: Negative for blurred vision.  Respiratory: Negative for cough, shortness of breath and wheezing.   Cardiovascular: Negative for chest pain and palpitations.  Gastrointestinal: as per HPI Genitourinary: Negative for dysuria, urgency, frequency and hematuria.  Musculoskeletal: Positive for myalgias, back pain and joint pain.  Skin: Positive for itching and rash.  Neurological: Negative for dizziness, tremors, focal weakness, seizures and loss of consciousness.  Endo/Heme/Allergies: Positive for seasonal allergies.  Psychiatric/Behavioral: Negative for suicidal ideas and hallucinations.  Positive for depression and anxiety All other systems reviewed and are negative.   Physical Exam: Vitals:   06/16/18 0833  BP: 110/76  Pulse: 96   Body mass index is 33 kg/m. Gen:      No acute  distress HEENT:  EOMI, sclera anicteric Neck:     No masses; no thyromegaly Lungs:    Clear to auscultation bilaterally; normal respiratory effort CV:         Regular rate and rhythm; no murmurs Abd:      + bowel sounds; soft, non-tender; no palpable masses, no distension Ext:    No edema; adequate peripheral perfusion Skin:      Warm and dry; no rash Neuro: alert and oriented x 3 Psych: normal mood and affect  Data Reviewed:  Reviewed labs, radiology  imaging, old records and pertinent past GI work up   Assessment and Plan/Recommendations:  52 year old female with complaints of solid food dysphagia, chronic GERD with abdominal bloating alternating diarrhea and constipation  We will check TTG IgA antibody to exclude celiac disease given family history of celiac disease in first-degree relative  Scheduled for EGD for evaluation of dysphagia and possible esophageal dilation Due for colorectal cancer screening, will schedule colonoscopy along with EGD  The risks and benefits as well as alternatives of endoscopic procedure(s) have been discussed and reviewed. All questions answered. The patient agrees to proceed.  Will discuss further work-up of bloating and IBS with alternating constipation and diarrhea based on results of EGD and colonoscopy  K. Denzil Magnuson , MD [phone]    CC: Martinique, Betty G, MD

## 2018-06-16 NOTE — Patient Instructions (Addendum)
You have been scheduled for an endoscopy and colonoscopy. Please follow the written instructions given to you at your visit today. Please pick up your prep supplies at the pharmacy within the next 1-3 days. If you use inhalers (even only as needed), please bring them with you on the day of your procedure. Your physician has requested that you go to www.startemmi.com and enter the access code given to you at your visit today. This web site gives a general overview about your procedure. However, you should still follow specific instructions given to you by our office regarding your preparation for the procedure.  Go to the basement for labs today  If you are age 51 or older, your body mass index should be between 23-30. Your Body mass index is 33 kg/m. If this is out of the aforementioned range listed, please consider follow up with your Primary Care Provider.  If you are age 53 or younger, your body mass index should be between 19-25. Your Body mass index is 33 kg/m. If this is out of the aformentioned range listed, please consider follow up with your Primary Care Provider.    Thank you for choosing [facility]  Karleen Hampshire Nandigam,MD

## 2018-06-19 LAB — RETICULIN ANTIBODIES, IGA W TITER: Reticulin IGA Screen: NEGATIVE

## 2018-06-19 LAB — GLIADIN ANTIBODIES, SERUM
Gliadin IgA: 7 Units
Gliadin IgG: 2 Units

## 2018-06-19 LAB — TISSUE TRANSGLUTAMINASE, IGA: (TTG) AB, IGA: 1 U/mL

## 2018-06-20 ENCOUNTER — Encounter: Payer: Self-pay | Admitting: Gastroenterology

## 2018-06-20 NOTE — Progress Notes (Deleted)
Office Visit Note  Patient: Angela Everett             Date of Birth: [birth year]           MRN: [id number]             PCP: Martinique, Betty G, MD Referring: Martinique, Betty G, MD Visit Date: 06/27/2018 Occupation: @GUAROCC @  Subjective:  No chief complaint on file.   History of Present Illness: Angela Everett is a 52 y.o. female ***   Activities of Daily Living:  Patient reports morning stiffness for *** {minute/hour:19697}.   Patient {ACTIONS;DENIES/REPORTS:21021675::"Denies"} nocturnal pain.  Difficulty dressing/grooming: {ACTIONS;DENIES/REPORTS:21021675::"Denies"} Difficulty climbing stairs: {ACTIONS;DENIES/REPORTS:21021675::"Denies"} Difficulty getting out of chair: {ACTIONS;DENIES/REPORTS:21021675::"Denies"} Difficulty using hands for taps, buttons, cutlery, and/or writing: {ACTIONS;DENIES/REPORTS:21021675::"Denies"}  No Rheumatology ROS completed.   PMFS History:  Patient Active Problem List   Diagnosis Date Noted  . Polyarthralgia 04/11/2018  . Positive ANA (antinuclear antibody) 04/11/2018  . Vitamin D deficiency, unspecified 04/09/2018  . Hypertension, essential, benign 08/09/2017  . Hyperlipidemia 08/09/2017    Past Medical History:  Diagnosis Date  . Anxiety   . Depression   . Gallstones   . GERD (gastroesophageal reflux disease)   . Hyperlipidemia   . Hypertension     Family History  Problem Relation Age of Onset  . Depression Mother   . Heart disease Mother   . Hypertension Mother   . Birth defects Father   . Cancer Father        mets  . Heart disease Father   . Marfan syndrome Father   . ADD / ADHD Daughter   . Alcohol abuse Daughter   . Depression Daughter   . Drug abuse Daughter   . Learning disabilities Daughter   . Allergy (severe) Daughter   . Depression Son   . Allergy (severe) Son        Eosinophilic esophagitis  . Heart disease Maternal Grandmother   . Heart attack Maternal Grandmother   . Cancer Paternal  Grandmother        brain tumor  . Early death Paternal Grandmother   . Alcohol abuse Paternal Grandfather   . Depression Paternal Grandfather   . Bladder Cancer Paternal Grandfather   . Depression Daughter   . Hearing loss Daughter   . Celiac disease Daughter    Past Surgical History:  Procedure Laterality Date  . CERVICAL SPINE SURGERY  2011  . CESAREAN SECTION  2000, 2002, 2005   x 3  . CHOLECYSTECTOMY  2006   Social History   Social History Narrative  . Not on file    Objective: Vital Signs: LMP 06/13/2018    Physical Exam   Musculoskeletal Exam: ***  CDAI Exam: CDAI Score: Not documented Patient Global Assessment: Not documented; Provider Global Assessment: Not documented Swollen: Not documented; Tender: Not documented Joint Exam   Not documented   There is currently no information documented on the homunculus. Go to the Rheumatology activity and complete the homunculus joint exam.  Investigation: No additional findings.  Imaging: No results found.  Recent Labs: Lab Results  Component Value Date   WBC 5.1 05/12/2018   HGB 13.2 05/12/2018   PLT 329 05/12/2018   NA 139 02/21/2018   K 3.8 02/21/2018   CL 102 02/21/2018   CO2 28 02/21/2018   GLUCOSE 96 02/21/2018   BUN 11 02/21/2018   CREATININE 0.84 02/21/2018   BILITOT 0.4 02/21/2018   ALKPHOS 59 02/21/2018   AST 22 02/21/2018  ALT 16 02/21/2018   PROT 6.8 05/12/2018   ALBUMIN 4.2 02/21/2018   CALCIUM 9.3 02/21/2018  UA negative, SPEP negative, renal globulins normal Lupus anticoagulant negative, ESR 11, CK 57, uric acid 5.9, anti-tTG negative, antigliadin negative  Speciality Comments: No specialty comments available.  Procedures:  No procedures performed Allergies: Latex and Corn-containing products   Assessment / Plan:     Visit Diagnoses: No diagnosis found.   Orders: No orders of the defined types were placed in this encounter.  No orders of the defined types were placed in this  encounter.   Face-to-face time spent with patient was *** minutes. Greater than 50% of time was spent in counseling and coordination of care.  Follow-Up Instructions: No follow-ups on file.   Bo Merino, MD  Note - This record has been created using Editor, commissioning.  Chart creation errors have been sought, but may not always  have been located. Such creation errors do not reflect on  the standard of medical care.

## 2018-06-23 ENCOUNTER — Telehealth: Payer: Self-pay | Admitting: Rheumatology

## 2018-06-23 NOTE — Telephone Encounter (Signed)
Patient called stating she finished her prescription of Vitamin D and is checking if she needs to have bloodwork before her appointment or if she can have it at the appointment which is scheduled for 07/18/18.  Patient needed to reschedule her appointment due to not having childcare coverage.

## 2018-06-24 NOTE — Telephone Encounter (Signed)
Attempted to contact the patient and left message for patient to call the office.  

## 2018-06-27 ENCOUNTER — Ambulatory Visit: Payer: Self-pay | Admitting: Rheumatology

## 2018-07-07 ENCOUNTER — Ambulatory Visit (AMBULATORY_SURGERY_CENTER): Payer: BLUE CROSS/BLUE SHIELD | Admitting: Gastroenterology

## 2018-07-07 ENCOUNTER — Other Ambulatory Visit: Payer: Self-pay

## 2018-07-07 ENCOUNTER — Encounter: Payer: Self-pay | Admitting: Gastroenterology

## 2018-07-07 VITALS — BP 92/59 | HR 71 | Temp 97.5°F | Resp 14 | Ht 67.0 in | Wt 215.0 lb

## 2018-07-07 DIAGNOSIS — Z1211 Encounter for screening for malignant neoplasm of colon: Secondary | ICD-10-CM

## 2018-07-07 DIAGNOSIS — K2970 Gastritis, unspecified, without bleeding: Secondary | ICD-10-CM

## 2018-07-07 DIAGNOSIS — K2950 Unspecified chronic gastritis without bleeding: Secondary | ICD-10-CM | POA: Diagnosis not present

## 2018-07-07 DIAGNOSIS — R1310 Dysphagia, unspecified: Secondary | ICD-10-CM

## 2018-07-07 DIAGNOSIS — K449 Diaphragmatic hernia without obstruction or gangrene: Secondary | ICD-10-CM

## 2018-07-07 HISTORY — PX: COLONOSCOPY: SHX174

## 2018-07-07 HISTORY — PX: UPPER GI ENDOSCOPY: SHX6162

## 2018-07-07 MED ORDER — OMEPRAZOLE 40 MG PO CPDR: 40.0000 mg | DELAYED_RELEASE_CAPSULE | Freq: Two times a day (BID) | ORAL | 2 refills | Status: DC

## 2018-07-07 MED ORDER — SODIUM CHLORIDE 0.9 % IV SOLN: 500.0000 mL | Freq: Once | INTRAVENOUS | Status: DC

## 2018-07-07 NOTE — Progress Notes (Signed)
Office Visit Note  Patient: Angela Everett             Date of Birth: [birth year]           MRN: [id number]             PCP: Martinique, Betty G, MD Referring: Martinique, Betty G, MD Visit Date: 07/18/2018 Occupation: @GUAROCC @  Subjective:  Fatigue   History of Present Illness: Angela Everett is a 52 y.o. female with history of positive ANA, osteoarthritis, and myofascial pain syndrome.  Patient reports that since her last visit she has overall started to feel better.  She continues to have pain in both hands and feet.  She reports difficulty opening jars and with fine motor movements.  She has stiffness in both hands that lasts all day.  She has started wearing more supportive shoes which has been helping with the feet discomfort. She has left knee discomfort intermittently.  She has pain going up and down steps.  She denies any mechanical symptoms or knee joint swelling.  She denies any injuries. She continues to have bilateral lateral epicondylitis.  She has muscle aches and muscle tenderness due to myofascial pain syndrome, but she reports overall the myalgias have improved. She has tried to start losing weight, exercising, and working on good sleep hygiene habits, which she feels is contributing to her feeling better.   She denies any recent facial rashes.  She continues to have idiopathic hives.  She has chronic hair loss.  She denies any sun sensitivity.  She denies any symptoms of Raynaud's.  She denies any oral or nasal ulcerations. She continues to have sicca symptoms.  She denies any palpitations or shortness or breath.  She denies any recent low grade fevers but continues to have chronic fatigue.     Activities of Daily Living:  Patient reports morning stiffness for 10-15 minutes.   Patient Reports nocturnal pain.  Difficulty dressing/grooming: Denies Difficulty climbing stairs: Reports Difficulty getting out of chair: Denies Difficulty using hands for taps, buttons,  cutlery, and/or writing: Reports  Review of Systems  Constitutional: Positive for fatigue. Negative for night sweats, weight gain and weight loss.  HENT: Positive for mouth dryness. Negative for mouth sores, trouble swallowing, trouble swallowing and nose dryness.   Eyes: Positive for dryness. Negative for pain, redness, itching and visual disturbance.  Respiratory: Negative for cough, hemoptysis, shortness of breath, wheezing and difficulty breathing.   Cardiovascular: Negative for chest pain, palpitations, hypertension, irregular heartbeat and swelling in legs/feet.  Gastrointestinal: Negative for abdominal pain, blood in stool, constipation, diarrhea and nausea.  Endocrine: Negative for increased urination.  Genitourinary: Negative for painful urination, nocturia, pelvic pain and vaginal dryness.  Musculoskeletal: Positive for arthralgias, joint pain and morning stiffness. Negative for joint swelling, myalgias, muscle weakness, muscle tenderness and myalgias.  Skin: Positive for hair loss. Negative for color change, pallor, rash, nodules/bumps, skin tightness, ulcers and sensitivity to sunlight.  Allergic/Immunologic: Negative for susceptible to infections.  Neurological: Negative for dizziness, light-headedness, numbness, headaches, memory loss, night sweats and weakness.  Hematological: Negative for bruising/bleeding tendency and swollen glands.  Psychiatric/Behavioral: Negative for depressed mood, confusion and sleep disturbance. The patient is not nervous/anxious.     PMFS History:  Patient Active Problem List   Diagnosis Date Noted  . Primary osteoarthritis of both hands 07/08/2018  . Primary osteoarthritis of both knees 07/08/2018  . Primary osteoarthritis of both feet 07/08/2018  . Myofascial pain 07/08/2018  . Polyarthralgia 04/11/2018  .  Positive ANA (antinuclear antibody) 04/11/2018  . Vitamin D deficiency, unspecified 04/09/2018  . Hypertension, essential, benign 08/09/2017   . Hyperlipidemia 08/09/2017    Past Medical History:  Diagnosis Date  . Anxiety   . Depression   . Gallstones   . GERD (gastroesophageal reflux disease)   . Hyperlipidemia   . Hypertension     Family History  Problem Relation Age of Onset  . Depression Mother   . Heart disease Mother   . Hypertension Mother   . Birth defects Father   . Cancer Father        mets  . Heart disease Father   . Marfan syndrome Father   . ADD / ADHD Daughter   . Alcohol abuse Daughter   . Depression Daughter   . Drug abuse Daughter   . Learning disabilities Daughter   . Allergy (severe) Daughter   . Depression Son   . Allergy (severe) Son        Eosinophilic esophagitis  . Heart disease Maternal Grandmother   . Heart attack Maternal Grandmother   . Cancer Paternal Grandmother        brain tumor  . Early death Paternal Grandmother   . Alcohol abuse Paternal Grandfather   . Depression Paternal Grandfather   . Bladder Cancer Paternal Grandfather   . Depression Daughter   . Hearing loss Daughter   . Celiac disease Daughter    Past Surgical History:  Procedure Laterality Date  . CERVICAL SPINE SURGERY  2011  . CESAREAN SECTION  2000, 2002, 2005   x 3  . CHOLECYSTECTOMY  2006  . COLONOSCOPY  07/07/2018  . UPPER GI ENDOSCOPY  07/07/2018   Social History   Social History Narrative  . Not on file    Objective: Vital Signs: BP (!) 121/92 (BP Location: Left Arm, Patient Position: Sitting, Cuff Size: Normal)   Pulse 78   Resp 14   Ht 5\' 8"  (1.727 m)   Wt 218 lb (98.9 kg)   BMI 33.15 kg/m    Physical Exam  Constitutional: She is oriented to person, place, and time. She appears well-developed and well-nourished.  HENT:  Head: Normocephalic and atraumatic.  Eyes: Conjunctivae and EOM are normal.  Neck: Normal range of motion.  Cardiovascular: Normal rate, regular rhythm, normal heart sounds and intact distal pulses.  Pulmonary/Chest: Effort normal and breath sounds normal.    Abdominal: Soft. Bowel sounds are normal.  Lymphadenopathy:    She has no cervical adenopathy.  Neurological: She is alert and oriented to person, place, and time.  Skin: Skin is warm and dry. Capillary refill takes less than 2 seconds.  Psychiatric: She has a normal mood and affect. Her behavior is normal.  Nursing note and vitals reviewed.    Musculoskeletal Exam: C-spine, thoracic spine, and lumbar spine good ROM.  No midline spinal tenderness.  No SI joint tenderness.  Shoulder joints, elbow joints, wrist joints, MCPs, PIPs, and DIPs good ROM with no synovitis.  Mild PIP and DIP synovial thickening.  Hip joints, knee joints, ankle joints, MTPs, PIPs, and DIPs good ROM with no synovitis.  No warmth or effusion of knee joints.  No tenderness or swelling of ankle joints.  No tenderness of trochanteric bursa bilaterally.    CDAI Exam: CDAI Score: Not documented Patient Global Assessment: Not documented; Provider Global Assessment: Not documented Swollen: Not documented; Tender: Not documented Joint Exam   Not documented   There is currently no information documented on the homunculus.  Go to the Rheumatology activity and complete the homunculus joint exam.  Investigation: Findings:  05/21/18 AVISE index -1.0 ANA 1: 160 nuclear speckled, (dsDNA, Smith, RNP, SSA, SSB, SCL 70, Jo 1, CB CAP negative) anticardiolipin antibody negative, beta-2 GP 1-, RF negative, anti-CCP negative, antithyroglobulin negative, antithyroid peroxidase negative   Imaging: No results found.  Recent Labs: Lab Results  Component Value Date   WBC 5.1 05/12/2018   HGB 13.2 05/12/2018   PLT 329 05/12/2018   NA 139 02/21/2018   K 3.8 02/21/2018   CL 102 02/21/2018   CO2 28 02/21/2018   GLUCOSE 96 02/21/2018   BUN 11 02/21/2018   CREATININE 0.84 02/21/2018   BILITOT 0.4 02/21/2018   ALKPHOS 59 02/21/2018   AST 22 02/21/2018   ALT 16 02/21/2018   PROT 6.8 05/12/2018   ALBUMIN 4.2 02/21/2018   CALCIUM  9.3 02/21/2018    Speciality Comments: No specialty comments available.  Procedures:  No procedures performed Allergies: Latex   Assessment / Plan:     Visit Diagnoses: Positive ANA (antinuclear antibody) - ANA 1:160, ENA negative. H/o Doxycyclin intake.  Patient has no clinical features of autoimmune disease.  She will notify me if she develops any new symptoms.  She has some sicca symptoms.  For which over-the-counter products were discussed.  Primary osteoarthritis of both hands-joint protection muscle strengthening was discussed.  Primary osteoarthritis of both knees - bilateral moderate and severe chondromalacia patella.  She continues to have some discomfort in her knee joints.  She may benefit from exercises and topical NSAIDs.  Side effects were discussed.  She was given a prescription for Voltaren gel.  A list of natural anti-inflammatories were provided.  Primary osteoarthritis of both feet-proper fitting shoes were discussed.  Myofascial pain-she continues to have some generalized discomfort and fatigue.  Need for regular exercise and good sleep hygiene was discussed.  Rash - h/o hives.  Resolved  Hypertension, essential, benign-her diastolic blood pressure is elevated.  Vitamin D deficiency, unspecified-patient took vitamin D for about a month and then stopped.  She has been taking over-the-counter supplements.  We will check vitamin D level today.  Hyperlipidemia, unspecified hyperlipidemia type   Orders: Orders Placed This Encounter  Procedures  . VITAMIN D 25 Hydroxy (Vit-D Deficiency, Fractures)   Meds ordered this encounter  Medications  . diclofenac sodium (VOLTAREN) 1 % GEL    Sig: 3 grams to 3 large joints up to 3 times daily    Dispense:  3 Tube    Refill:  3    Face-to-face time spent with patient was 30 minutes. Greater than 50% of time was spent in counseling and coordination of care.  Follow-Up Instructions: Return in about 1 year (around  07/19/2019) for Osteoarthritis, Positive ANA.   Bo Merino, MD  Note - This record has been created using Editor, commissioning.  Chart creation errors have been sought, but may not always  have been located. Such creation errors do not reflect on  the standard of medical care.

## 2018-07-07 NOTE — Patient Instructions (Signed)
Discharge instructions given. Handout on Gastritis,diverticulosis and hemorrhoids.  Biopsies taken. Resume previous medications. Prescription sent to pharmacy. YOU HAD AN ENDOSCOPIC PROCEDURE TODAY AT [facility]:   Refer to the procedure report that was given to you for any specific questions about what was found during the examination.  If the procedure report does not answer your questions, please call your gastroenterologist to clarify.  If you requested that your care partner not be given the details of your procedure findings, then the procedure report has been included in a sealed envelope for you to review at your convenience later.  YOU SHOULD EXPECT: Some feelings of bloating in the abdomen. Passage of more gas than usual.  Walking can help get rid of the air that was put into your GI tract during the procedure and reduce the bloating. If you had a lower endoscopy (such as a colonoscopy or flexible sigmoidoscopy) you may notice spotting of blood in your stool or on the toilet paper. If you underwent a bowel prep for your procedure, you may not have a normal bowel movement for a few days.  Please Note:  You might notice some irritation and congestion in your nose or some drainage.  This is from the oxygen used during your procedure.  There is no need for concern and it should clear up in a day or so.  SYMPTOMS TO REPORT IMMEDIATELY:   Following lower endoscopy (colonoscopy or flexible sigmoidoscopy):  Excessive amounts of blood in the stool  Significant tenderness or worsening of abdominal pains  Swelling of the abdomen that is new, acute  Fever of 100F or higher   Following upper endoscopy (EGD)  Vomiting of blood or coffee ground material  New chest pain or pain under the shoulder blades  Painful or persistently difficult swallowing  New shortness of breath  Fever of 100F or higher  Black, tarry-looking stools  For urgent or emergent issues, a  gastroenterologist can be reached at any hour by calling [phone].   DIET:  We do recommend a small meal at first, but then you may proceed to your regular diet.  Drink plenty of fluids but you should avoid alcoholic beverages for 24 hours.  ACTIVITY:  You should plan to take it easy for the rest of today and you should NOT DRIVE or use heavy machinery until tomorrow (because of the sedation medicines used during the test).    FOLLOW UP: Our staff will call the number listed on your records the next business day following your procedure to check on you and address any questions or concerns that you may have regarding the information given to you following your procedure. If we do not reach you, we will leave a message.  However, if you are feeling well and you are not experiencing any problems, there is no need to return our call.  We will assume that you have returned to your regular daily activities without incident.  If any biopsies were taken you will be contacted by phone or by letter within the next 1-3 weeks.  Please call us at [phone] if you have not heard about the biopsies in 3 weeks.    SIGNATURES/CONFIDENTIALITY: You and/or your care partner have signed paperwork which will be entered into your electronic medical record.  These signatures attest to the fact that that the information above on your After Visit Summary has been reviewed and is understood.  Full responsibility of the confidentiality of this discharge information  lies with you and/or your care-partner.

## 2018-07-07 NOTE — Progress Notes (Signed)
EGD scope placed, pt coughing, sedation increased. Laryngospasm noted. 100% O2 via BVM and + pressure. Spasm resolved quickly. O2 via NRBM with 100% sats.

## 2018-07-07 NOTE — Op Note (Signed)
[facility] Patient Name: Angela Everett Procedure Date: 07/07/2018 3:07 PM MRN: [id number] Endoscopist: Mauri Pole , MD Age: 52 Referring MD:  Date of Birth: 11/04/1965 Gender: Female Account #: [id number] Procedure:                Colonoscopy Indications:              Screening for colorectal malignant neoplasm Medicines:                Monitored Anesthesia Care Procedure:                Pre-Anesthesia Assessment:                           - Prior to the procedure, a History and Physical                            was performed, and patient medications and                            allergies were reviewed. The patient's tolerance of                            previous anesthesia was also reviewed. The risks                            and benefits of the procedure and the sedation                            options and risks were discussed with the patient.                            All questions were answered, and informed consent                            was obtained. Prior Anticoagulants: The patient has                            taken no previous anticoagulant or antiplatelet                            agents. ASA Grade Assessment: II - A patient with                            mild systemic disease. After reviewing the risks                            and benefits, the patient was deemed in                            satisfactory condition to undergo the procedure.                           After obtaining informed consent, the colonoscope  was passed under direct vision. Throughout the                            procedure, the patient's blood pressure, pulse, and                            oxygen saturations were monitored continuously. The                            Colonoscope was introduced through the anus and                            advanced to the the cecum, identified by                            appendiceal  orifice and ileocecal valve. The                            colonoscopy was performed without difficulty. The                            patient tolerated the procedure well. The quality                            of the bowel preparation was excellent. The                            ileocecal valve, appendiceal orifice, and rectum                            were photographed. Scope In: 3:30:55 PM Scope Out: 3:46:55 PM Scope Withdrawal Time: 0 hours 9 minutes 8 seconds  Total Procedure Duration: 0 hours 16 minutes 0 seconds  Findings:                 The perianal and digital rectal examinations were                            normal.                           A few small-mouthed diverticula were found in the                            sigmoid colon.                           Non-bleeding internal hemorrhoids were found during                            retroflexion. The hemorrhoids were small.                           The exam was otherwise normal throughout the  examined colon. Complications:            No immediate complications. Estimated Blood Loss:     Estimated blood loss was minimal. Impression:               - Diverticulosis in the sigmoid colon.                           - Non-bleeding internal hemorrhoids.                           - No specimens collected. Recommendation:           - Patient has a contact number available for                            emergencies. The signs and symptoms of potential                            delayed complications were discussed with the                            patient. Return to normal activities tomorrow.                            Written discharge instructions were provided to the                            patient.                           - Resume previous diet.                           - Continue present medications.                           - Repeat colonoscopy in 10 years for screening                             purposes. Mauri Pole, MD 07/07/2018 3:51:59 PM This report has been signed electronically.

## 2018-07-07 NOTE — Progress Notes (Signed)
Pt's states no medical or surgical changes since previsit or office visit. 

## 2018-07-07 NOTE — Progress Notes (Signed)
Called to room to assist during endoscopic procedure.  Patient ID and intended procedure confirmed with present staff. Received instructions for my participation in the procedure from the performing physician.  

## 2018-07-07 NOTE — Op Note (Signed)
[place] [place] Patient Name: Angela Everett Procedure Date: 07/07/2018 3:07 PM MRN: [id number] Endoscopist: Mauri Pole , MD Age: 52 Referring MD:  Date of Birth: 08-Jan-1966 Gender: Female Account #: [id number] Procedure:                Upper GI endoscopy Indications:              Dysphagia Medicines:                Monitored Anesthesia Care Procedure:                Pre-Anesthesia Assessment:                           - Prior to the procedure, a History and Physical                            was performed, and patient medications and                            allergies were reviewed. The patient's tolerance of                            previous anesthesia was also reviewed. The risks                            and benefits of the procedure and the sedation                            options and risks were discussed with the patient.                            All questions were answered, and informed consent                            was obtained. Prior Anticoagulants: The patient has                            taken no previous anticoagulant or antiplatelet                            agents. ASA Grade Assessment: II - A patient with                            mild systemic disease. After reviewing the risks                            and benefits, the patient was deemed in                            satisfactory condition to undergo the procedure.                           - Prior to the procedure, a History and Physical  was performed, and patient medications and                            allergies were reviewed. The patient's tolerance of                            previous anesthesia was also reviewed. The risks                            and benefits of the procedure and the sedation                            options and risks were discussed with the patient.                            All questions were answered, and informed  consent                            was obtained. Prior Anticoagulants: The patient has                            taken no previous anticoagulant or antiplatelet                            agents. ASA Grade Assessment: II - A patient with                            mild systemic disease. After reviewing the risks                            and benefits, the patient was deemed in                            satisfactory condition to undergo the procedure.                           After obtaining informed consent, the endoscope was                            passed under direct vision. Throughout the                            procedure, the patient's blood pressure, pulse, and                            oxygen saturations were monitored continuously. The                            Model GIF-HQ190 [id number]) scope was introduced                            through the mouth, and advanced to the second part  of duodenum. The upper GI endoscopy was somewhat                            difficult due to the patient's oxygen desaturation.                            Successful completion of the procedure was aided by                            decreasing the dose of sedation medication,                            performing chin lift, administering oxygen and                            treating with ventilation. The patient tolerated                            the procedure well. Scope In: Scope Out: Findings:                 The Z-line was regular and was found 38 cm from the                            incisors.                           No endoscopic abnormality was evident in the                            esophagus to explain the patient's complaint of                            dysphagia. Biopsies were obtained from the proximal                            and distal esophagus with cold forceps for                            histology of suspected eosinophilic  esophagitis.                           A small hiatal hernia was present.                           Patchy mild inflammation with hemorrhage                            characterized by congestion (edema), erosions and                            erythema was found in the gastric antrum. Biopsies                            were taken with a cold forceps for Helicobacter  pylori testing.                           The examined duodenum was normal. Complications:            No immediate complications. Estimated Blood Loss:     Estimated blood loss was minimal. Impression:               - Z-line regular, 38 cm from the incisors.                           - No endoscopic esophageal abnormality to explain                            patient's dysphagia. Biopsied.                           - Small hiatal hernia.                           - Gastritis with hemorrhage. Biopsied.                           - Normal examined duodenum. Recommendation:           - Patient has a contact number available for                            emergencies. The signs and symptoms of potential                            delayed complications were discussed with the                            patient. Return to normal activities tomorrow.                            Written discharge instructions were provided to the                            patient.                           - Resume previous diet.                           - Continue present medications.                           - Await pathology results.                           - No ibuprofen, naproxen, or other non-steroidal                            anti-inflammatory drugs.                           - Use Prilosec (omeprazole)  40 mg PO BID for 2                            months followed by once daily.                           - Return in 2 months to GI office for follow up                            visit                            - Follow an antireflux regimen indefinitely. This                            includes:                           - Do not lie down for at least 3 to 4 hours after                            meals.                           - Raise the head of the bed 4 to 6 inches.                           - Decrease excess weight.                           - Avoid citrus juices and other acidic foods,                            alcohol, chocolate, mints, coffee and other                            caffeinated beverages, carbonated beverages, fatty                            and fried foods.                           - Avoid tight-fitting clothing.                           - Avoid cigarettes and other tobacco products. Mauri Pole, MD 07/07/2018 4:00:04 PM This report has been signed electronically.

## 2018-07-07 NOTE — Progress Notes (Signed)
Pt awke, to recovery, report to RN, VSS

## 2018-07-08 ENCOUNTER — Telehealth: Payer: Self-pay | Admitting: *Deleted

## 2018-07-08 DIAGNOSIS — M7918 Myalgia, other site: Secondary | ICD-10-CM | POA: Insufficient documentation

## 2018-07-08 DIAGNOSIS — M170 Bilateral primary osteoarthritis of knee: Secondary | ICD-10-CM | POA: Insufficient documentation

## 2018-07-08 DIAGNOSIS — M19071 Primary osteoarthritis, right ankle and foot: Secondary | ICD-10-CM | POA: Insufficient documentation

## 2018-07-08 DIAGNOSIS — M19072 Primary osteoarthritis, left ankle and foot: Secondary | ICD-10-CM

## 2018-07-08 DIAGNOSIS — M19042 Primary osteoarthritis, left hand: Secondary | ICD-10-CM

## 2018-07-08 DIAGNOSIS — M19041 Primary osteoarthritis, right hand: Secondary | ICD-10-CM | POA: Insufficient documentation

## 2018-07-08 NOTE — Telephone Encounter (Signed)
  Follow up Call-  Call back number 07/07/2018  Post procedure Call Back phone  # [phone]  Permission to leave phone message Yes     Patient questions:  Do you have a fever, pain , or abdominal swelling? No. Pain Score  0 *  Have you tolerated food without any problems? Yes.    Have you been able to return to your normal activities? Yes.    Do you have any questions about your discharge instructions: Diet   Yes.   Medications  No. Follow up visit  No.  Do you have questions or concerns about your Care? Yes.  Slight sensation of fullness in the lower esophagus. No pain noted; slight sore throat #2 suggested salt water gargles. Will call if worse.  Actions: * If pain score is 4 or above: No action needed, pain <4.

## 2018-07-08 NOTE — Telephone Encounter (Signed)
  Follow up Call-  Call back number 07/07/2018  Post procedure Call Back phone  # [phone]  Permission to leave phone message Yes     Patient questions:   Mailbox is full.

## 2018-07-14 ENCOUNTER — Telehealth: Payer: Self-pay | Admitting: Gastroenterology

## 2018-07-14 ENCOUNTER — Encounter: Payer: Self-pay | Admitting: Gastroenterology

## 2018-07-14 NOTE — Telephone Encounter (Signed)
Returned [facility] call  Per EGD report from 11/11 Dr Silverio Decamp wanted patient on 40 mg omeprazole twice daily for 2 months  Dx Gastritis

## 2018-07-18 ENCOUNTER — Ambulatory Visit: Payer: BLUE CROSS/BLUE SHIELD | Admitting: Rheumatology

## 2018-07-18 ENCOUNTER — Encounter: Payer: Self-pay | Admitting: Rheumatology

## 2018-07-18 VITALS — BP 121/92 | HR 78 | Resp 14 | Ht 68.0 in | Wt 218.0 lb

## 2018-07-18 DIAGNOSIS — M19071 Primary osteoarthritis, right ankle and foot: Secondary | ICD-10-CM | POA: Diagnosis not present

## 2018-07-18 DIAGNOSIS — M19041 Primary osteoarthritis, right hand: Secondary | ICD-10-CM

## 2018-07-18 DIAGNOSIS — M19072 Primary osteoarthritis, left ankle and foot: Secondary | ICD-10-CM

## 2018-07-18 DIAGNOSIS — M19042 Primary osteoarthritis, left hand: Secondary | ICD-10-CM

## 2018-07-18 DIAGNOSIS — M7918 Myalgia, other site: Secondary | ICD-10-CM

## 2018-07-18 DIAGNOSIS — E559 Vitamin D deficiency, unspecified: Secondary | ICD-10-CM

## 2018-07-18 DIAGNOSIS — M170 Bilateral primary osteoarthritis of knee: Secondary | ICD-10-CM | POA: Diagnosis not present

## 2018-07-18 DIAGNOSIS — E785 Hyperlipidemia, unspecified: Secondary | ICD-10-CM

## 2018-07-18 DIAGNOSIS — R21 Rash and other nonspecific skin eruption: Secondary | ICD-10-CM

## 2018-07-18 DIAGNOSIS — R768 Other specified abnormal immunological findings in serum: Secondary | ICD-10-CM

## 2018-07-18 DIAGNOSIS — I10 Essential (primary) hypertension: Secondary | ICD-10-CM

## 2018-07-18 MED ORDER — DICLOFENAC SODIUM 1 % TD GEL: TRANSDERMAL | 3 refills | Status: DC

## 2018-07-19 LAB — VITAMIN D 25 HYDROXY (VIT D DEFICIENCY, FRACTURES): Vit D, 25-Hydroxy: 24 ng/mL — ABNORMAL LOW (ref 30–100)

## 2018-07-22 ENCOUNTER — Telehealth: Payer: Self-pay | Admitting: *Deleted

## 2018-07-22 DIAGNOSIS — E559 Vitamin D deficiency, unspecified: Secondary | ICD-10-CM

## 2018-07-22 MED ORDER — VITAMIN D (ERGOCALCIFEROL) 1.25 MG (50000 UNIT) PO CAPS: 50000.0000 [IU] | ORAL_CAPSULE | ORAL | 0 refills | Status: DC

## 2018-07-22 NOTE — Telephone Encounter (Signed)
-----   Message from Ofilia Neas, PA-C sent at 07/21/2018 10:23 AM EST ----- Vitamin D improved but still low at 24. Please send in vitamin D 50,000 units by mouth twice a week for 3 months. We will recheck vitamin D in 3 months.

## 2018-08-18 ENCOUNTER — Other Ambulatory Visit: Payer: Self-pay | Admitting: Rheumatology

## 2018-08-18 ENCOUNTER — Other Ambulatory Visit: Payer: Self-pay | Admitting: Family Medicine

## 2018-08-18 DIAGNOSIS — I10 Essential (primary) hypertension: Secondary | ICD-10-CM

## 2018-09-24 ENCOUNTER — Other Ambulatory Visit: Payer: Self-pay | Admitting: Rheumatology

## 2018-10-06 ENCOUNTER — Encounter: Payer: Self-pay | Admitting: Gastroenterology

## 2018-11-08 ENCOUNTER — Other Ambulatory Visit: Payer: Self-pay | Admitting: Gastroenterology

## 2018-11-08 DIAGNOSIS — R1310 Dysphagia, unspecified: Secondary | ICD-10-CM

## 2018-11-17 ENCOUNTER — Other Ambulatory Visit: Payer: Self-pay | Admitting: Family Medicine

## 2018-11-17 DIAGNOSIS — I10 Essential (primary) hypertension: Secondary | ICD-10-CM

## 2018-11-22 ENCOUNTER — Other Ambulatory Visit: Payer: Self-pay | Admitting: Rheumatology

## 2018-11-24 NOTE — Telephone Encounter (Signed)
Patient advised will need to have vitamin d rechecked prior to refill. Patient will call before going to the lab to have orders released.

## 2018-12-12 ENCOUNTER — Telehealth: Payer: Self-pay | Admitting: Family Medicine

## 2018-12-12 NOTE — Telephone Encounter (Signed)
Fine with me

## 2018-12-12 NOTE — Telephone Encounter (Signed)
Patient called in and wants to transfer to Dr. Rogers Blocker from Dr. [place] with her new insurance the only PCP is Dr. Rogers Blocker that her insurance will cover.  Please advise.

## 2018-12-17 NOTE — Telephone Encounter (Signed)
Fine with me. Thanks, BJ 

## 2019-03-10 ENCOUNTER — Other Ambulatory Visit: Payer: Self-pay | Admitting: Family Medicine

## 2019-03-10 DIAGNOSIS — I10 Essential (primary) hypertension: Secondary | ICD-10-CM

## 2019-04-02 ENCOUNTER — Encounter: Payer: Self-pay | Admitting: Family Medicine

## 2019-04-02 ENCOUNTER — Ambulatory Visit (INDEPENDENT_AMBULATORY_CARE_PROVIDER_SITE_OTHER): Payer: PRIVATE HEALTH INSURANCE | Admitting: Family Medicine

## 2019-04-02 ENCOUNTER — Other Ambulatory Visit: Payer: Self-pay

## 2019-04-02 VITALS — Temp 97.0°F

## 2019-04-02 DIAGNOSIS — I10 Essential (primary) hypertension: Secondary | ICD-10-CM

## 2019-04-02 DIAGNOSIS — J0100 Acute maxillary sinusitis, unspecified: Secondary | ICD-10-CM

## 2019-04-02 MED ORDER — AMOXICILLIN-POT CLAVULANATE 875-125 MG PO TABS: 1.0000 | ORAL_TABLET | Freq: Two times a day (BID) | ORAL | 0 refills | Status: DC

## 2019-04-02 MED ORDER — FLUTICASONE PROPIONATE 50 MCG/ACT NA SUSP: 1.0000 | Freq: Every day | NASAL | 6 refills | Status: DC

## 2019-04-02 NOTE — Patient Instructions (Signed)
We have put in an order for you to be tested for COVID-19.  You do not need an appointment to go get testing -- please proceed to one of the following drive-up testing locations at your convenience. The line closes at 3:30pm daily. Test results may take up to a week to return.  [facility] Location: [address], [place] (old [facility]) Hours: 8a-3:45p, M-F  [facility] Location: [address], [place], [place] [address] [place] [facility]) Hours: 8a-3:45p, M-F  [facility] Location: [facility] (across from [facility]) Hours: 8a-3:45p, M-F

## 2019-04-02 NOTE — Progress Notes (Signed)
Virtual Visit via Video Note  Subjective  CC:  Chief Complaint  Patient presents with  . Sinus Problem    Reports headaches, dizziness, and head congestion.. Off and on for 1 week. Takes Zyrtec daily, has tried Benadryl and Sudafed Plus.. Denies fever and body aches    Same day acute visit; PCP not available. New pt to me. Chart reviewed.   I connected with Ronie Spies on 04/02/19 at  3:40 PM EDT by a video enabled telemedicine application and verified that I am speaking with the correct person using two identifiers. Location patient: Home Location provider: [facility] [place], Office Persons participating in the virtual visit: Sky Primo, Leamon Arnt, MD Lilli Light, [place] discussed the limitations of evaluation and management by telemedicine and the availability of in person appointments. The patient expressed understanding and agreed to proceed. HPI: Angela Everett is a 53 y.o. female who was contacted today to address the problems listed above in the chief complaint. . Very pleasant 53 year old female who complains of about 1 week of sinus symptoms including facial pressure, intermittent vertigo/lightheadedness, mild sore throat with postnasal drainage, mild malaise without fever, chill, cough or known exposure to COVID-19.  She has had sinus infections in the past.  She has been treating herself with allergy medicines.  She suffers from allergic rhinitis and takes Zyrtec year-round.  She denies shortness of breath.  She also has frontal headaches.  They are not exertional, not severe without fevers or neck stiffness.  She has no GI symptoms.  No known exposures to COVID but daughter does lifeguard in a popular pool. . She does have hypertension and wonders if her blood pressures running high or low.  She does not check at home.  She has had purposeful weight loss and wonders if her blood pressure medications needs to be  adjusted Assessment  1. Acute non-recurrent maxillary sinusitis   2. Hypertension, essential, benign      Plan   Symptom complex most consistent with acute sinusitis but did discuss possible atypical covert infection: Treat for sinusitis with Augmentin and Flonase.  Advil as needed.  Patient to be tested tomorrow at the community testing site.  Instructions given in the after visit summary.  All questions answered.  Follow-up as needed.  Patient to make follow-up appointment with her PCP to adjust blood pressure medications.  Recommend good hydration, careful when standing from a seated position to avoid falls and check home blood pressures frequently. I discussed the assessment and treatment plan with the patient. The patient was provided an opportunity to ask questions and all were answered. The patient agreed with the plan and demonstrated an understanding of the instructions.   The patient was advised to call back or seek an in-person evaluation if the symptoms worsen or if the condition fails to improve as anticipated. Follow up: No follow-ups on file.  04/22/2019  Meds ordered this encounter  Medications  . amoxicillin-clavulanate (AUGMENTIN) 875-125 MG tablet    Sig: Take 1 tablet by mouth 2 (two) times daily.    Dispense:  14 tablet    Refill:  0  . fluticasone (FLONASE) 50 MCG/ACT nasal spray    Sig: Place 1 spray into both nostrils daily.    Dispense:  16 g    Refill:  6      I reviewed the patients updated PMH, FH, and SocHx.    Patient Active Problem List  Diagnosis Date Noted  . Primary osteoarthritis of both hands 07/08/2018  . Primary osteoarthritis of both knees 07/08/2018  . Primary osteoarthritis of both feet 07/08/2018  . Myofascial pain 07/08/2018  . Polyarthralgia 04/11/2018  . Positive ANA (antinuclear antibody) 04/11/2018  . Vitamin D deficiency, unspecified 04/09/2018  . Hypertension, essential, benign 08/09/2017  . Hyperlipidemia 08/09/2017    Current Meds  Medication Sig  . cetirizine (ZYRTEC) 10 MG tablet Take 1 tablet (10 mg total) by mouth daily.  . cholecalciferol (VITAMIN D) 1000 units tablet Take 1,000 Units by mouth daily.  . diclofenac sodium (VOLTAREN) 1 % GEL 3 grams to 3 large joints up to 3 times daily  . hydrochlorothiazide (HYDRODIURIL) 12.5 MG tablet TAKE 1 TABLET BY MOUTH EVERY DAY  . irbesartan (AVAPRO) 300 MG tablet TAKE 1 TABLET BY MOUTH EVERY DAY. NEED FOLLOW-UP VIRTUAL VISIT FOR MORE REFILLS.  . Multiple Vitamins-Minerals (MULTIVITAMIN ADULT PO) Take by mouth daily.  . [DISCONTINUED] OLIVE LEAF PO Take by mouth daily.  . [DISCONTINUED] OVER THE COUNTER MEDICATION   . [DISCONTINUED] Vitamin D, Ergocalciferol, (DRISDOL) 1.25 MG (50000 UT) CAPS capsule Take 1 capsule (50,000 Units total) by mouth 2 (two) times a week.   Current Facility-Administered Medications for the 04/02/19 encounter (Office Visit) with Leamon Arnt, MD  Medication  . 0.9 %  sodium chloride infusion    Allergies: Patient is allergic to latex. Family History: Patient family history includes ADD / ADHD in her daughter; Alcohol abuse in her daughter and paternal grandfather; Allergy (severe) in her daughter and son; Birth defects in her father; Bladder Cancer in her paternal grandfather; Cancer in her father and paternal grandmother; Celiac disease in her daughter; Depression in her daughter, daughter, mother, paternal grandfather, and son; Drug abuse in her daughter; Early death in her paternal grandmother; Hearing loss in her daughter; Heart attack in her maternal grandmother; Heart disease in her father, maternal grandmother, and mother; Hypertension in her mother; Learning disabilities in her daughter; Marfan syndrome in her father. Social History:  Patient  reports that she quit smoking about 21 years ago. Her smoking use included cigarettes. She has never used smokeless tobacco. She reports current alcohol use. She reports that she does  not use drugs.  Review of Systems: Constitutional: Negative for fever malaise or anorexia Cardiovascular: negative for chest pain Respiratory: negative for SOB or persistent cough Gastrointestinal: negative for abdominal pain  OBJECTIVE Vitals: Temp (!) 97 F (36.1 C) (Oral)  General: no acute distress , A&Ox3 No respiratory symptoms.  Leamon Arnt, MD

## 2019-04-03 ENCOUNTER — Other Ambulatory Visit: Payer: Self-pay | Admitting: *Deleted

## 2019-04-03 DIAGNOSIS — Z20822 Contact with and (suspected) exposure to covid-19: Secondary | ICD-10-CM

## 2019-04-05 LAB — NOVEL CORONAVIRUS, NAA: SARS-CoV-2, NAA: NOT DETECTED

## 2019-04-11 ENCOUNTER — Other Ambulatory Visit: Payer: Self-pay | Admitting: Family Medicine

## 2019-04-11 DIAGNOSIS — I10 Essential (primary) hypertension: Secondary | ICD-10-CM

## 2019-04-22 ENCOUNTER — Encounter: Payer: Self-pay | Admitting: Family Medicine

## 2019-04-22 ENCOUNTER — Other Ambulatory Visit: Payer: Self-pay

## 2019-04-22 ENCOUNTER — Ambulatory Visit (INDEPENDENT_AMBULATORY_CARE_PROVIDER_SITE_OTHER): Payer: PRIVATE HEALTH INSURANCE | Admitting: Family Medicine

## 2019-04-22 VITALS — BP 120/82 | HR 70 | Temp 98.3°F | Ht 68.0 in | Wt 191.8 lb

## 2019-04-22 DIAGNOSIS — Z20828 Contact with and (suspected) exposure to other viral communicable diseases: Secondary | ICD-10-CM

## 2019-04-22 DIAGNOSIS — E559 Vitamin D deficiency, unspecified: Secondary | ICD-10-CM

## 2019-04-22 DIAGNOSIS — I10 Essential (primary) hypertension: Secondary | ICD-10-CM

## 2019-04-22 DIAGNOSIS — Z0000 Encounter for general adult medical examination without abnormal findings: Secondary | ICD-10-CM

## 2019-04-22 DIAGNOSIS — Z20822 Contact with and (suspected) exposure to covid-19: Secondary | ICD-10-CM

## 2019-04-22 DIAGNOSIS — F329 Major depressive disorder, single episode, unspecified: Secondary | ICD-10-CM

## 2019-04-22 DIAGNOSIS — Z23 Encounter for immunization: Secondary | ICD-10-CM | POA: Diagnosis not present

## 2019-04-22 DIAGNOSIS — F32A Depression, unspecified: Secondary | ICD-10-CM | POA: Insufficient documentation

## 2019-04-22 DIAGNOSIS — E782 Mixed hyperlipidemia: Secondary | ICD-10-CM

## 2019-04-22 DIAGNOSIS — L821 Other seborrheic keratosis: Secondary | ICD-10-CM | POA: Diagnosis not present

## 2019-04-22 DIAGNOSIS — Z114 Encounter for screening for human immunodeficiency virus [HIV]: Secondary | ICD-10-CM | POA: Diagnosis not present

## 2019-04-22 DIAGNOSIS — F419 Anxiety disorder, unspecified: Secondary | ICD-10-CM

## 2019-04-22 LAB — CBC WITH DIFFERENTIAL/PLATELET
Basophils Absolute: 0 10*3/uL (ref 0.0–0.1)
Basophils Relative: 0.8 % (ref 0.0–3.0)
Eosinophils Absolute: 0.1 10*3/uL (ref 0.0–0.7)
Eosinophils Relative: 2.6 % (ref 0.0–5.0)
HCT: 41 % (ref 36.0–46.0)
Hemoglobin: 13.7 g/dL (ref 12.0–15.0)
Lymphocytes Relative: 39.4 % (ref 12.0–46.0)
Lymphs Abs: 1.7 10*3/uL (ref 0.7–4.0)
MCHC: 33.5 g/dL (ref 30.0–36.0)
MCV: 91.4 fl (ref 78.0–100.0)
Monocytes Absolute: 0.3 10*3/uL (ref 0.1–1.0)
Monocytes Relative: 6 % (ref 3.0–12.0)
Neutro Abs: 2.3 10*3/uL (ref 1.4–7.7)
Neutrophils Relative %: 51.2 % (ref 43.0–77.0)
Platelets: 274 10*3/uL (ref 150.0–400.0)
RBC: 4.49 Mil/uL (ref 3.87–5.11)
RDW: 14 % (ref 11.5–15.5)
WBC: 4.4 10*3/uL (ref 4.0–10.5)

## 2019-04-22 LAB — COMPREHENSIVE METABOLIC PANEL
ALT: 16 U/L (ref 0–35)
AST: 18 U/L (ref 0–37)
Albumin: 4.3 g/dL (ref 3.5–5.2)
Alkaline Phosphatase: 54 U/L (ref 39–117)
BUN: 13 mg/dL (ref 6–23)
CO2: 30 mEq/L (ref 19–32)
Calcium: 10 mg/dL (ref 8.4–10.5)
Chloride: 104 mEq/L (ref 96–112)
Creatinine, Ser: 0.82 mg/dL (ref 0.40–1.20)
GFR: 73.06 mL/min (ref >60.00)
Glucose, Bld: 104 mg/dL — ABNORMAL HIGH (ref 70–99)
Potassium: 4.2 mEq/L (ref 3.5–5.1)
Sodium: 142 mEq/L (ref 135–145)
Total Bilirubin: 0.5 mg/dL (ref 0.2–1.2)
Total Protein: 6.9 g/dL (ref 6.0–8.3)

## 2019-04-22 LAB — LIPID PANEL
Cholesterol: 233 mg/dL — ABNORMAL HIGH (ref 0–200)
HDL: 68.9 mg/dL (ref >39.00)
LDL Cholesterol: 147 mg/dL — ABNORMAL HIGH (ref 0–99)
NonHDL: 164.18
Total CHOL/HDL Ratio: 3
Triglycerides: 84 mg/dL (ref 0.0–149.0)
VLDL: 16.8 mg/dL (ref 0.0–40.0)

## 2019-04-22 LAB — MICROALBUMIN / CREATININE URINE RATIO
Creatinine,U: 47.2 mg/dL
Microalb Creat Ratio: 1.5 mg/g (ref 0.0–30.0)
Microalb, Ur: <0.7 mg/dL (ref 0.0–1.9)

## 2019-04-22 LAB — VITAMIN D 25 HYDROXY (VIT D DEFICIENCY, FRACTURES): VITD: 28.87 ng/mL — ABNORMAL LOW (ref 30.00–100.00)

## 2019-04-22 LAB — TSH: TSH: 0.54 u[IU]/mL (ref 0.35–4.50)

## 2019-04-22 LAB — SARS-COV-2 IGG: SARS-COV-2 IgG: 0.02

## 2019-04-22 MED ORDER — DULOXETINE HCL 30 MG PO CPEP: 30.0000 mg | ORAL_CAPSULE | Freq: Every day | ORAL | 1 refills | Status: DC

## 2019-04-22 NOTE — Progress Notes (Signed)
Patient: Angela Everett MRN: [id number] DOB: 1966/05/05 PCP: Orma Flaming, MD     Subjective:  Chief Complaint  Patient presents with  . Transitions Of Care  . Annual Exam    HPI: The patient is a 53 y.o. female who presents today for annual exam. She denies any changes to past medical history. There have been no recent hospitalizations. They are following a well balanced diet and exercise plan. Weight has been decreasing steadily. She has lost 25+ pounds in a year and is still working on losing weight. In peri menopause stage, not sexually active.   Hypertension: Here for follow up of hypertension.  Currently on hctz and avapro . Doesn't take her readings at home. Takes medication as prescribed and denies any side effects. Exercise includes running (2 miles every other day). Weight has been slowly decreasing (intentially). Denies any chest pain, headaches, shortness of breath, vision changes, swelling in lower extremities. She does complain of some dizziness and wonders if her blood pressure is too low.   Hyperlipidemia: checking labs today. On no medication.   Vit. D deficiency: checking today. She takes 3000IU/day.   She has complaints of anxiety and depression: she has had this "forever." has been on lots of medication (lexapro, prozac). Also has PTSD from college incident. She is not a fan of being on medication. Last time she was medicated was in 2007-2008. No hx of si/attempt. 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 She has done counseling in the past, meditation, exercise. She is currently exercising. Her oldest daughter is an addict and this also affects her.   Has a mole that she would like me to look at on her upper left leg. Thinks it feels funny and looks different. Not growing, changing color, bleeding. No hx of skin cancer.   Also wants to check for covid ab. Thinks she had this in February.   Immunization History  Administered  Date(s) Administered  . Influenza,inj,Quad PF,6+ Mos 08/09/2017, 04/22/2019   Colonoscopy:06/2018 Mammogram: overdue. Will give handout Pap smear: come back for this. Due  Hiv: done today    Review of Systems  Constitutional: Positive for fatigue. Negative for chills and fever.  HENT: Positive for congestion. Negative for dental problem, ear pain, hearing loss, postnasal drip, rhinorrhea, sore throat and trouble swallowing.   Eyes: Negative for visual disturbance.  Respiratory: Negative for cough, chest tightness and shortness of breath.   Cardiovascular: Negative for chest pain, palpitations and leg swelling.  Gastrointestinal: Negative for abdominal pain, blood in stool, diarrhea, nausea and vomiting.  Endocrine: Negative for cold intolerance, polydipsia, polyphagia and polyuria.  Genitourinary: Negative for dysuria and hematuria.  Musculoskeletal: Positive for arthralgias and neck pain. Negative for back pain.       Pt followed by rheumatology  Skin: Negative for rash.  Neurological: Negative for dizziness and headaches.  Psychiatric/Behavioral: Positive for sleep disturbance. Negative for dysphoric mood. The patient is not nervous/anxious.     Allergies Patient is allergic to latex.  Past Medical History Patient  has a past medical history of Anxiety, Depression, Gallstones, GERD (gastroesophageal reflux disease), Hyperlipidemia, and Hypertension.  Surgical History Patient  has a past surgical history that includes Cholecystectomy (2006); Cesarean section (2000, 2002, 2005); Cervical spine surgery (2011); Colonoscopy (07/07/2018); and Upper gi endoscopy (07/07/2018).  Family History Pateint's family history includes ADD / ADHD in her daughter; Alcohol abuse in her daughter and paternal grandfather; Allergy (severe) in her daughter and  son; Birth defects in her father; Bladder Cancer in her paternal grandfather; Cancer in her father and paternal grandmother; Celiac disease in  her daughter; Depression in her daughter, daughter, mother, paternal grandfather, and son; Drug abuse in her daughter; Early death in her paternal grandmother; Hearing loss in her daughter; Heart attack in her maternal grandmother; Heart disease in her father, maternal grandmother, and mother; Hypertension in her mother; Learning disabilities in her daughter; Marfan syndrome in her father.  Social History Patient  reports that she quit smoking about 21 years ago. Her smoking use included cigarettes. She has never used smokeless tobacco. She reports current alcohol use. She reports that she does not use drugs.    Objective: Vitals:   04/22/19 0948  BP: 120/82  Pulse: 70  Temp: 98.3 F (36.8 C)  TempSrc: Oral  SpO2: 99%  Weight: 191 lb 12.8 oz (87 kg)  Height: 5\' 8"  (1.727 m)    Body mass index is 29.16 kg/m.   Orthostatic VS for the past 24 hrs:  BP- Lying Pulse- Lying BP- Sitting Pulse- Sitting BP- Standing at 0 minutes Pulse- Standing at 0 minutes  04/22/19 1139 111/79 63 116/81 76 117/85 71    Physical Exam Vitals signs reviewed.  Constitutional:      Appearance: Normal appearance. She is well-developed. She is obese.  HENT:     Head: Normocephalic and atraumatic.     Right Ear: Tympanic membrane, ear canal and external ear normal.     Left Ear: Tympanic membrane, ear canal and external ear normal.     Nose: Nose normal.     Mouth/Throat:     Mouth: Mucous membranes are moist.  Eyes:     Extraocular Movements: Extraocular movements intact.     Conjunctiva/sclera: Conjunctivae normal.     Pupils: Pupils are equal, round, and reactive to light.  Neck:     Musculoskeletal: Normal range of motion and neck supple.     Thyroid: No thyromegaly.  Cardiovascular:     Rate and Rhythm: Normal rate and regular rhythm.     Heart sounds: Normal heart sounds. No murmur.  Pulmonary:     Effort: Pulmonary effort is normal.     Breath sounds: Normal breath sounds.  Abdominal:      General: Bowel sounds are normal. There is no distension.     Palpations: Abdomen is soft.     Tenderness: There is no abdominal tenderness.  Lymphadenopathy:     Cervical: No cervical adenopathy.  Skin:    General: Skin is warm and dry.     Findings: Lesion (19mm SK, light brown on left upper leg. anterior aspect ) present. No rash.  Neurological:     General: No focal deficit present.     Mental Status: She is alert and oriented to person, place, and time.     Cranial Nerves: No cranial nerve deficit.     Coordination: Coordination normal.     Deep Tendon Reflexes: Reflexes normal.  Psychiatric:        Mood and Affect: Mood normal.        Behavior: Behavior normal.    GAD 7 : Generalized Anxiety Score 04/22/2019  Nervous, Anxious, on Edge 2  Control/stop worrying 2  Worry too much - different things 2  Trouble relaxing 3  Restless 0  Easily annoyed or irritable 0  Afraid - awful might happen 2  Total GAD 7 Score 11  Anxiety Difficulty Not difficult at all  Depression screen PHQ 2/9 04/22/2019  Decreased Interest 1  Down, Depressed, Hopeless 1  PHQ - 2 Score 2  Altered sleeping 3  Tired, decreased energy 1  Change in appetite 0  Feeling bad or failure about yourself  2  Trouble concentrating 2  Moving slowly or fidgety/restless 0  Suicidal thoughts 0  PHQ-9 Score 10  Difficult doing work/chores Not difficult at all       Assessment/plan: 1. Annual physical exam Routine labs today. Discussed getting her HM utd as well as flu shot today. mmg information given and she will come back for pap smear. Encouraged continued weight loss and really proud of her for weight she has lost. F/u in one year for annual Patient counseling [x]    Nutrition: Stressed importance of moderation in sodium/caffeine intake, saturated fat and cholesterol, caloric balance, sufficient intake of fresh fruits, vegetables, fiber, calcium, iron, and 1 mg of folate supplement per day (for females  capable of pregnancy).  [x]    Stressed the importance of regular exercise.   []    Substance Abuse: Discussed cessation/primary prevention of tobacco, alcohol, or other drug use; driving or other dangerous activities under the influence; availability of treatment for abuse.   [x]    Injury prevention: Discussed safety belts, safety helmets, smoke detector, smoking near bedding or upholstery.   [x]    Sexuality: Discussed sexually transmitted diseases, partner selection, use of condoms, avoidance of unintended pregnancy  and contraceptive alternatives.  [x]    Dental health: Discussed importance of regular tooth brushing, flossing, and dental visits.  [x]    Health maintenance and immunizations reviewed. Please refer to Health maintenance section.     2. Hypertension, essential, benign Blood pressure is to goal. Continue current anti-hypertensive medications. No Refills given and routine lab work will be done today. Recommended routine exercise and healthy diet including DASH diet and mediterranean diet. Encouraged weight loss. Orthostatics wnl. Dizziness could be from her bp going too low with weight loss. Recommended she keep a log for me and drink more water. F/u prn.   - Comprehensive metabolic panel - CBC with Differential/Platelet - Microalbumin / creatinine urine ratio - TSH - Lipid panel  3. Mixed hyperlipidemia   4. Seborrheic keratosis Information given and explained benign nature of this. Can freeze if becomes irritated or bothersome to her.   5. Vitamin D deficiency On 3000IU/daily after high dose repletion. Checking today  - VITAMIN D 25 Hydroxy (Vit-D Deficiency, Fractures)  6. Anxiety and depression Has been off medication for over 10 years; however, her gad7 and phq9 score are mildly elevated and she admits to having worsening symptoms. Discussed with possible fibromyalgia we should do trial of cymbalta and see if we can get help with pain control as well as have some positive  effects on her anxiety and depression. She is willing to do this. I've explained to her that drugs of the SNRI class can have side effects such as weight gain, sexual dysfunction, insomnia, headache, nausea. These medications are generally effective at alleviating symptoms of anxiety and/or depression and help with fibro pain. Let me know if significant side effects do occur. Will see her back for f/u in one month.    7. Encounter for screening for HIV  - HIV Antibody (routine testing w rflx)  8. Exposure to Covid-19 Virus Discussed may be too late to detect ab since was in February with illness, but we will check today for her.  - SARS-COV-2 IgG  9. Need for immunization against  influenza  - Flu Vaccine QUAD 36+ mos IM   Return in about 1 month (around 05/23/2019) for cymbalta check up .    Orma Flaming, MD [place]  04/22/2019

## 2019-04-22 NOTE — Patient Instructions (Signed)
Checking labs on your today for everything!  Keep log of blood pressure and drink plenty of water.    Preventive Care 57-52 Years Old, Female Preventive care refers to visits with your health care provider and lifestyle choices that can promote health and wellness. This includes:  A yearly physical exam. This may also be called an annual well check.  Regular dental visits and eye exams.  Immunizations.  Screening for certain conditions.  Healthy lifestyle choices, such as eating a healthy diet, getting regular exercise, not using drugs or products that contain nicotine and tobacco, and limiting alcohol use. What can I expect for my preventive care visit? Physical exam Your health care provider will check your:  Height and weight. This may be used to calculate body mass index (BMI), which tells if you are at a healthy weight.  Heart rate and blood pressure.  Skin for abnormal spots. Counseling Your health care provider may ask you questions about your:  Alcohol, tobacco, and drug use.  Emotional well-being.  Home and relationship well-being.  Sexual activity.  Eating habits.  Work and work Statistician.  Method of birth control.  Menstrual cycle.  Pregnancy history. What immunizations do I need?  Influenza (flu) vaccine  This is recommended every year. Tetanus, diphtheria, and pertussis (Tdap) vaccine  You may need a Td booster every 52 years. Varicella (chickenpox) vaccine  You may need this if you have not been vaccinated. Zoster (shingles) vaccine  You may need this after age 52. Measles, mumps, and rubella (MMR) vaccine  You may need at least one dose of MMR if you were born in 1957 or later. You may also need a second dose. Pneumococcal conjugate (PCV13) vaccine  You may need this if you have certain conditions and were not previously vaccinated. Pneumococcal polysaccharide (PPSV23) vaccine  You may need one or two doses if you smoke cigarettes or  if you have certain conditions. Meningococcal conjugate (MenACWY) vaccine  You may need this if you have certain conditions. Hepatitis A vaccine  You may need this if you have certain conditions or if you travel or work in places where you may be exposed to hepatitis A. Hepatitis B vaccine  You may need this if you have certain conditions or if you travel or work in places where you may be exposed to hepatitis B. Haemophilus influenzae type b (Hib) vaccine  You may need this if you have certain conditions. Human papillomavirus (HPV) vaccine  If recommended by your health care provider, you may need three doses over 6 months. You may receive vaccines as individual doses or as more than one vaccine together in one shot (combination vaccines). Talk with your health care provider about the risks and benefits of combination vaccines. What tests do I need? Blood tests  Lipid and cholesterol levels. These may be checked every 5 years, or more frequently if you are over 47 years old.  Hepatitis C test.  Hepatitis B test. Screening  Lung cancer screening. You may have this screening every year starting at age 52 if you have a 30-pack-year history of smoking and currently smoke or have quit within the past 15 years.  Colorectal cancer screening. All adults should have this screening starting at age 34 and continuing until age 52. Your health care provider may recommend screening at age 52 if you are at increased risk. You will have tests every 1-10 years, depending on your results and the type of screening test.  Diabetes screening. This  is done by checking your blood sugar (glucose) after you have not eaten for a while (fasting). You may have this done every 1-3 years.  Mammogram. This may be done every 1-2 years. Talk with your health care provider about when you should start having regular mammograms. This may depend on whether you have a family history of breast cancer.  BRCA-related  cancer screening. This may be done if you have a family history of breast, ovarian, tubal, or peritoneal cancers.  Pelvic exam and Pap test. This may be done every 3 years starting at age 52. Starting at age 52, this may be done every 5 years if you have a Pap test in combination with an HPV test. Other tests  Sexually transmitted disease (STD) testing.  Bone density scan. This is done to screen for osteoporosis. You may have this scan if you are at high risk for osteoporosis. Follow these instructions at home: Eating and drinking  Eat a diet that includes fresh fruits and vegetables, whole grains, lean protein, and low-fat dairy.  Take vitamin and mineral supplements as recommended by your health care provider.  Do not drink alcohol if: ? Your health care provider tells you not to drink. ? You are pregnant, may be pregnant, or are planning to become pregnant.  If you drink alcohol: ? Limit how much you have to 0-1 drink a day. ? Be aware of how much alcohol is in your drink. In the [place] one drink equals one 12 oz bottle of beer (355 mL), one 5 oz glass of wine (148 mL), or one 1 oz glass of hard liquor (44 mL). Lifestyle  Take daily care of your teeth and gums.  Stay active. Exercise for at least 30 minutes on 5 or more days each week.  Do not use any products that contain nicotine or tobacco, such as cigarettes, e-cigarettes, and chewing tobacco. If you need help quitting, ask your health care provider.  If you are sexually active, practice safe sex. Use a condom or other form of birth control (contraception) in order to prevent pregnancy and STIs (sexually transmitted infections).  If told by your health care provider, take low-dose aspirin daily starting at age 71. What's next?  Visit your health care provider once a year for a well check visit.  Ask your health care provider how often you should have your eyes and teeth checked.  Stay up to date on all vaccines. This  information is not intended to replace advice given to you by your health care provider. Make sure you discuss any questions you have with your health care provider. Document Released: 09/09/2015 Document Revised: 04/24/2018 Document Reviewed: 04/24/2018 Elsevier Patient Education  2020 [employer].   Seborrheic Keratosis A seborrheic keratosis is a common, noncancerous (benign) skin growth. These growths are velvety, waxy, rough, tan, brown, or black spots that appear on the skin. These skin growths can be flat or raised, and scaly. What are the causes? The cause of this condition is not known. What increases the risk? You are more likely to develop this condition if you:  Have a family history of seborrheic keratosis.  Are 50 or older.  Are pregnant.  Have had estrogen replacement therapy. What are the signs or symptoms? Symptoms of this condition include growths on the face, chest, shoulders, back, or other areas. These growths:  Are usually painless, but may become irritated and itchy.  Can be yellow, brown, black, or other colors.  Are slightly raised  or have a flat surface.  Are sometimes rough or wart-like in texture.  Are often velvety or waxy on the surface.  Are round or oval-shaped.  Often occur in groups, but may occur as a single growth. How is this diagnosed? This condition is diagnosed with a medical history and physical exam.  A sample of the growth may be tested (skin biopsy).  You may need to see a skin specialist (dermatologist). How is this treated? Treatment is not usually needed for this condition, unless the growths are irritated or bleed often.  You may also choose to have the growths removed if you do not like their appearance. ? Most commonly, these growths are treated with a procedure in which liquid nitrogen is applied to "freeze" off the growth (cryosurgery). ? They may also be burned off with electricity (electrocautery) or removed by  scraping (curettage). Follow these instructions at home:  Watch your growth for any changes.  Keep all follow-up visits as told by your health care provider. This is important.  Do not scratch or pick at the growth or growths. This can cause them to become irritated or infected. Contact a health care provider if:  You suddenly have many new growths.  Your growth bleeds, itches, or hurts.  Your growth suddenly becomes larger or changes color. Summary  A seborrheic keratosis is a common, noncancerous (benign) skin growth.  Treatment is not usually needed for this condition, unless the growths are irritated or bleed often.  Watch your growth for any changes.  Contact a health care provider if you suddenly have many new growths or your growth suddenly becomes larger or changes color.  Keep all follow-up visits as told by your health care provider. This is important. This information is not intended to replace advice given to you by your health care provider. Make sure you discuss any questions you have with your health care provider. Document Released: 09/15/2010 Document Revised: 12/26/2017 Document Reviewed: 12/26/2017 Elsevier Patient Education  2020 [employer].

## 2019-04-23 ENCOUNTER — Other Ambulatory Visit: Payer: Self-pay | Admitting: Family Medicine

## 2019-04-23 DIAGNOSIS — E782 Mixed hyperlipidemia: Secondary | ICD-10-CM

## 2019-04-23 DIAGNOSIS — E559 Vitamin D deficiency, unspecified: Secondary | ICD-10-CM

## 2019-04-23 LAB — HIV ANTIBODY (ROUTINE TESTING W REFLEX): HIV 1&2 Ab, 4th Generation: NONREACTIVE

## 2019-04-23 MED ORDER — VITAMIN D (ERGOCALCIFEROL) 1.25 MG (50000 UNIT) PO CAPS: ORAL_CAPSULE | ORAL | 0 refills | Status: DC

## 2019-05-06 ENCOUNTER — Encounter: Payer: Self-pay | Admitting: Family Medicine

## 2019-05-06 ENCOUNTER — Other Ambulatory Visit: Payer: Self-pay | Admitting: Family Medicine

## 2019-05-06 DIAGNOSIS — Z20822 Contact with and (suspected) exposure to covid-19: Secondary | ICD-10-CM

## 2019-05-06 DIAGNOSIS — Z20828 Contact with and (suspected) exposure to other viral communicable diseases: Secondary | ICD-10-CM

## 2019-05-08 ENCOUNTER — Other Ambulatory Visit: Payer: Self-pay | Admitting: *Deleted

## 2019-05-08 DIAGNOSIS — Z20822 Contact with and (suspected) exposure to covid-19: Secondary | ICD-10-CM

## 2019-05-08 NOTE — Progress Notes (Signed)
novel 

## 2019-05-10 LAB — NOVEL CORONAVIRUS, NAA: SARS-CoV-2, NAA: NOT DETECTED

## 2019-05-18 ENCOUNTER — Other Ambulatory Visit: Payer: Self-pay | Admitting: Family Medicine

## 2019-05-18 DIAGNOSIS — I10 Essential (primary) hypertension: Secondary | ICD-10-CM

## 2019-05-20 ENCOUNTER — Other Ambulatory Visit: Payer: Self-pay | Admitting: Family Medicine

## 2019-05-20 DIAGNOSIS — I10 Essential (primary) hypertension: Secondary | ICD-10-CM

## 2019-05-25 ENCOUNTER — Other Ambulatory Visit (HOSPITAL_COMMUNITY)
Admission: RE | Admit: 2019-05-25 | Discharge: 2019-05-25 | Disposition: A | Discharge status: Home / Self Care | Payer: PRIVATE HEALTH INSURANCE | Source: Ambulatory Visit | Attending: Family Medicine | Admitting: Family Medicine

## 2019-05-25 ENCOUNTER — Encounter: Payer: Self-pay | Admitting: Family Medicine

## 2019-05-25 ENCOUNTER — Other Ambulatory Visit: Payer: Self-pay

## 2019-05-25 ENCOUNTER — Ambulatory Visit (INDEPENDENT_AMBULATORY_CARE_PROVIDER_SITE_OTHER): Payer: PRIVATE HEALTH INSURANCE | Admitting: Family Medicine

## 2019-05-25 VITALS — BP 108/78 | HR 74 | Temp 98.0°F | Ht 68.0 in | Wt 194.8 lb

## 2019-05-25 DIAGNOSIS — F419 Anxiety disorder, unspecified: Secondary | ICD-10-CM | POA: Diagnosis not present

## 2019-05-25 DIAGNOSIS — F329 Major depressive disorder, single episode, unspecified: Secondary | ICD-10-CM

## 2019-05-25 DIAGNOSIS — Z01419 Encounter for gynecological examination (general) (routine) without abnormal findings: Secondary | ICD-10-CM | POA: Insufficient documentation

## 2019-05-25 DIAGNOSIS — F32A Depression, unspecified: Secondary | ICD-10-CM

## 2019-05-25 MED ORDER — FLUCONAZOLE 150 MG PO TABS: 150.0000 mg | ORAL_TABLET | Freq: Once | ORAL | 0 refills | Status: AC

## 2019-05-25 NOTE — Progress Notes (Signed)
SUBJECTIVE:  53 y.o. female for annual routine Pap and checkup. Menarche age 49 years. She is peri menopause. LMP was 3 months ago. No spotting in between. No hx of abnormal pap smears, no STDs. She is not sexually active. No vaginal complaints. No breast complaints. Last mmg was 03/2017 before she moved to [facility].  -no family hx of breast cancer. utd on cscope (06/2018).  She did smoke for 4 years. No hx since age 88 years old. No hx of hip fractures in parents and no hx of fractures/steroid use in her.    Fibro/anxiety: I started her on cymbalta at her last visit. She does feel like her mood is more stable and she thinks pain is improved. No side effects and tolerating well.   Current Outpatient Medications  Medication Sig Dispense Refill  . cetirizine (ZYRTEC) 10 MG tablet Take 1 tablet (10 mg total) by mouth daily. 30 tablet 0  . DULoxetine (CYMBALTA) 30 MG capsule Take 1 capsule (30 mg total) by mouth daily. 30 capsule 1  . hydrochlorothiazide (HYDRODIURIL) 12.5 MG tablet TAKE 1 TABLET BY MOUTH EVERY DAY 90 tablet 2  . irbesartan (AVAPRO) 300 MG tablet TAKE 1 TABLET BY MOUTH EVERY DAY. NEED FOLLOW-UP VIRTUAL VISIT FOR MORE REFILLS. 30 tablet 0  . Multiple Vitamins-Minerals (MULTIVITAMIN ADULT PO) Take by mouth daily.    . Vitamin D, Ergocalciferol, (DRISDOL) 1.25 MG (50000 UT) CAPS capsule One capsule by mouth once a week for 8 weeks. Then take 2000IU/day 8 capsule 0  . cholecalciferol (VITAMIN D) 1000 units tablet Take 1,000 Units by mouth daily.    . fluconazole (DIFLUCAN) 150 MG tablet Take 1 tablet (150 mg total) by mouth once for 1 dose. 1 tablet 0  . omeprazole (PRILOSEC) 40 MG capsule TAKE 1 CAPSULE BY MOUTH TWICE A DAY (Patient not taking: Reported on 04/02/2019) 180 capsule 0   Current Facility-Administered Medications  Medication Dose Route Frequency Provider Last Rate Last Dose  . 0.9 %  sodium chloride infusion  500 mL Intravenous Once Nandigam, Venia Minks, MD       Allergies: Latex   No LMP recorded. (Menstrual status: Irregular Periods).  ROS:  Feeling well. No dyspnea or chest pain on exertion.  No abdominal pain, change in bowel habits, black or bloody stools.  No urinary tract symptoms. GYN ROS: no breast pain or new or enlarging lumps on self exam, no vaginal bleeding, she complains of peri menopause. LMP 3 months ago. No neurological complaints.  OBJECTIVE:  The patient appears well, alert, oriented x 3, in no distress. BP 108/78 (BP Location: Left Arm, Patient Position: Sitting, Cuff Size: Large)   Pulse 74   Temp 98 F (36.7 C) (Skin)   Ht 5\' 8"  (1.727 m)   Wt 194 lb 12.8 oz (88.4 kg)   SpO2 98%   BMI 29.62 kg/m  ENT normal.  Neck supple. No adenopathy or thyromegaly. PERLA. Lungs are clear, good air entry, no wheezes, rhonchi or rales. S1 and S2 normal, no murmurs, regular rate and rhythm. Abdomen soft without tenderness, guarding, mass or organomegaly. Extremities show no edema, normal peripheral pulses. Neurological is normal, no focal findings.  BREAST EXAM: breasts appear normal, no suspicious masses, no skin or nipple changes or axillary nodes  PELVIC EXAM: normal external genitalia, vulva, vagina, cervix, uterus and adnexa. White cottage cheese discharge.    Office Visit from 05/25/2019 in [place]  PHQ-9 Total Score  4     GAD  7 : Generalized Anxiety Score 05/25/2019 04/22/2019  Nervous, Anxious, on Edge 1 2  Control/stop worrying 0 2  Worry too much - different things 0 2  Trouble relaxing 1 3  Restless 0 0  Easily annoyed or irritable 1 0  Afraid - awful might happen 0 2  Total GAD 7 Score 3 11  Anxiety Difficulty Not difficult at all Not difficult at all      ASSESSMENT:  well woman Anxiety/fibro Vaginal candidiasis     PLAN:  mammogram pap smear Diflucan x 1 for yeast infection  Anxiety/fibro: GAD7 and phq9 score with significant improvement. Pain also improved. Doing well on 30mg  of cymbalta. Will  continue this dosage. She is to let me know if issues. F/u in 6 months.   Orma Flaming, MD [place]

## 2019-05-25 NOTE — Patient Instructions (Signed)
Pap smear today.  Get mmg! Sent in pill for yeast infection.   Thanks so much for coming in!

## 2019-05-26 ENCOUNTER — Encounter: Payer: Self-pay | Admitting: Family Medicine

## 2019-05-31 ENCOUNTER — Other Ambulatory Visit: Payer: Self-pay | Admitting: Family Medicine

## 2019-05-31 DIAGNOSIS — I10 Essential (primary) hypertension: Secondary | ICD-10-CM

## 2019-06-02 LAB — CYTOLOGY - PAP
Chlamydia: NEGATIVE
Diagnosis: NEGATIVE
High risk HPV: NEGATIVE
Neisseria Gonorrhea: NEGATIVE
Trichomonas: NEGATIVE

## 2019-06-15 ENCOUNTER — Other Ambulatory Visit: Payer: Self-pay | Admitting: Family Medicine

## 2019-06-29 ENCOUNTER — Other Ambulatory Visit: Payer: Self-pay | Admitting: Family Medicine

## 2019-09-18 ENCOUNTER — Other Ambulatory Visit: Payer: Self-pay

## 2019-09-18 MED ORDER — DULOXETINE HCL 30 MG PO CPEP: 30.0000 mg | ORAL_CAPSULE | Freq: Every day | ORAL | 1 refills | Status: DC

## 2019-10-29 ENCOUNTER — Encounter: Payer: Self-pay | Admitting: Family Medicine

## 2019-10-30 ENCOUNTER — Other Ambulatory Visit: Payer: Self-pay

## 2019-10-30 DIAGNOSIS — I10 Essential (primary) hypertension: Secondary | ICD-10-CM

## 2019-10-30 MED ORDER — HYDROCHLOROTHIAZIDE 12.5 MG PO TABS: 12.5000 mg | ORAL_TABLET | Freq: Every day | ORAL | 2 refills | Status: DC

## 2019-11-28 ENCOUNTER — Other Ambulatory Visit: Payer: Self-pay | Admitting: Family Medicine

## 2020-05-04 ENCOUNTER — Encounter: Payer: Self-pay | Admitting: Family Medicine

## 2020-05-04 NOTE — Telephone Encounter (Signed)
Please schedule pt for an appointment.   Thank you.

## 2020-05-19 ENCOUNTER — Ambulatory Visit: Payer: BC Managed Care – PPO | Admitting: Family Medicine

## 2020-05-19 ENCOUNTER — Ambulatory Visit (INDEPENDENT_AMBULATORY_CARE_PROVIDER_SITE_OTHER)
Admission: RE | Admit: 2020-05-19 | Discharge: 2020-05-19 | Disposition: A | Discharge status: Home / Self Care | Payer: BC Managed Care – PPO | Source: Ambulatory Visit | Attending: Family Medicine | Admitting: Family Medicine

## 2020-05-19 ENCOUNTER — Encounter: Payer: Self-pay | Admitting: Family Medicine

## 2020-05-19 ENCOUNTER — Other Ambulatory Visit: Payer: Self-pay

## 2020-05-19 VITALS — BP 130/98 | HR 89 | Temp 97.8°F | Ht 68.0 in | Wt 199.6 lb

## 2020-05-19 DIAGNOSIS — F32A Depression, unspecified: Secondary | ICD-10-CM

## 2020-05-19 DIAGNOSIS — F329 Major depressive disorder, single episode, unspecified: Secondary | ICD-10-CM

## 2020-05-19 DIAGNOSIS — M7731 Calcaneal spur, right foot: Secondary | ICD-10-CM | POA: Diagnosis not present

## 2020-05-19 DIAGNOSIS — M5412 Radiculopathy, cervical region: Secondary | ICD-10-CM

## 2020-05-19 DIAGNOSIS — M79671 Pain in right foot: Secondary | ICD-10-CM | POA: Diagnosis not present

## 2020-05-19 DIAGNOSIS — F419 Anxiety disorder, unspecified: Secondary | ICD-10-CM

## 2020-05-19 MED ORDER — BUSPIRONE HCL 7.5 MG PO TABS: 7.5000 mg | ORAL_TABLET | Freq: Three times a day (TID) | ORAL | 0 refills | Status: DC

## 2020-05-19 NOTE — Patient Instructions (Addendum)
-  voltaren gel: over the counter. Put on your foot 4x/day, can even try on your neck.  -xrays of your foot and referral to Dr. Sharol Given (ortho surgeon who specializes in foot)   -xray of your neck.  -I think we will need mri, but I have to start with xray -pt would be an option, but you can't miss work..   For your anxiety we are going to start buspar. 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 You can take this up to three times a day, most people will do twice a day, but know if you are anxious you can do three times a day. I sent in 7.5mg  dosage. Max dose of this drug is 30mg /day. If you need to bump up to 15mg  twice a day you can do this. Will see you back in 1 month.  -recommend counseling -yoga or exercise of some kind that your foot will allow.   Come back sooner if stomach not better. We need to do labs for blood pressure/etc anyways at that time.    I have ordered xrays for you. At this time we do not have xrays in our clinic. You will have to go to our [place] clinic. The address is [address]  xray is located in the basement.  Hours of operation are M-F 8:30am to 5:00pm.  Closed for lunch between 12:30 and 1:00pm.
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
## 2020-06-01 ENCOUNTER — Other Ambulatory Visit: Payer: Self-pay

## 2020-06-01 ENCOUNTER — Encounter: Payer: Self-pay | Admitting: Family Medicine

## 2020-06-01 ENCOUNTER — Ambulatory Visit: Payer: BC Managed Care – PPO | Admitting: Family Medicine

## 2020-06-01 VITALS — BP 130/83 | HR 75 | Temp 97.6°F | Ht 68.0 in | Wt 199.8 lb

## 2020-06-01 DIAGNOSIS — F419 Anxiety disorder, unspecified: Secondary | ICD-10-CM | POA: Diagnosis not present

## 2020-06-01 DIAGNOSIS — E782 Mixed hyperlipidemia: Secondary | ICD-10-CM

## 2020-06-01 DIAGNOSIS — Z23 Encounter for immunization: Secondary | ICD-10-CM

## 2020-06-01 DIAGNOSIS — Z1159 Encounter for screening for other viral diseases: Secondary | ICD-10-CM | POA: Diagnosis not present

## 2020-06-01 DIAGNOSIS — F32A Depression, unspecified: Secondary | ICD-10-CM

## 2020-06-01 DIAGNOSIS — E559 Vitamin D deficiency, unspecified: Secondary | ICD-10-CM

## 2020-06-01 DIAGNOSIS — I10 Essential (primary) hypertension: Secondary | ICD-10-CM

## 2020-06-01 DIAGNOSIS — M47892 Other spondylosis, cervical region: Secondary | ICD-10-CM

## 2020-06-01 MED ORDER — IRBESARTAN 300 MG PO TABS: 300.0000 mg | ORAL_TABLET | Freq: Every day | ORAL | 3 refills | Status: DC

## 2020-06-01 MED ORDER — HYDROCHLOROTHIAZIDE 25 MG PO TABS: 25.0000 mg | ORAL_TABLET | Freq: Every day | ORAL | 3 refills | Status: DC

## 2020-06-01 NOTE — Progress Notes (Signed)
Patient: Angela Everett MRN: [id number] DOB: 06-Mar-1966 PCP: Orma Flaming, MD     Subjective:  Chief Complaint  Patient presents with  . Hypertension  . Anxiety    HPI: The patient is a 54 y.o. female who presents today for HTN/Anxiety. She says that she has been trying to take her B/P more at home because she states that it has been higher. She also says that she has not been taking the Buspar as much. She does not knnow if it has helped or not.  Hypertension: Here for follow up of hypertension.  Currently on hctz 12.5mg  daily and avapro 300mg /day . Home readings range from 573-220 URKYHCWC/37-62 diastolic. Takes medication as prescribed and denies any side effects. Exercise includes none.  Weight has been stable. Denies any chest pain, headaches, shortness of breath, vision changes, swelling in lower extremities.   Anxiety:  I started her on buspar 7.5mg  daily at our last visit 1 week ago. She is having a hard time taking the second dose. No adverse effects. Sleep has actually improved.  DDD of her cervical spine -xrayed her at last appointment. Shows degenerative change at c6-c7 with moderate to advanced djd. Wants to discuss this    Review of Systems  Constitutional: Negative for diaphoresis and fever.  Cardiovascular: Negative for chest pain and palpitations.  Gastrointestinal: Negative for abdominal pain.  Genitourinary: Negative for frequency and pelvic pain.  Musculoskeletal: Positive for neck pain.  Neurological: Negative for light-headedness and headaches.  Psychiatric/Behavioral: The patient is nervous/anxious.     Allergies Patient is allergic to latex.  Past Medical History Patient  has a past medical history of Anxiety, Depression, Gallstones, GERD (gastroesophageal reflux disease), Hyperlipidemia, and Hypertension.  Surgical History Patient  has a past surgical history that includes Cholecystectomy (2006); Cesarean section (2000, 2002, 2005);  Cervical spine surgery (2011); Colonoscopy (07/07/2018); and Upper gi endoscopy (07/07/2018).  Family History Pateint's family history includes ADD / ADHD in her daughter; Alcohol abuse in her daughter and paternal grandfather; Allergy (severe) in her daughter and son; Birth defects in her father; Bladder Cancer in her paternal grandfather; Cancer in her father and paternal grandmother; Celiac disease in her daughter; Depression in her daughter, daughter, mother, paternal grandfather, and son; Drug abuse in her daughter; Early death in her paternal grandmother; Hearing loss in her daughter; Heart attack in her maternal grandmother; Heart disease in her father, maternal grandmother, and mother; Hypertension in her mother; Learning disabilities in her daughter; Marfan syndrome in her father.  Social History Patient  reports that she quit smoking about 22 years ago. Her smoking use included cigarettes. She has never used smokeless tobacco. She reports current alcohol use. She reports that she does not use drugs.    Objective: Vitals:   06/01/20 0808  BP: 130/83  Pulse: 75  Temp: 97.6 F (36.4 C)  TempSrc: Temporal  SpO2: 100%  Weight: 199 lb 12.8 oz (90.6 kg)  Height: 5\' 8"  (1.727 m)    Body mass index is 30.38 kg/m.  Physical Exam Vitals reviewed.  Constitutional:      Appearance: Normal appearance. She is well-developed. She is obese.  HENT:     Head: Normocephalic and atraumatic.     Right Ear: External ear normal.     Left Ear: External ear normal.  Eyes:     Conjunctiva/sclera: Conjunctivae normal.     Pupils: Pupils are equal, round, and reactive to light.  Neck:     Thyroid: No thyromegaly.  Cardiovascular:  Rate and Rhythm: Normal rate and regular rhythm.     Heart sounds: Normal heart sounds. No murmur heard.   Pulmonary:     Effort: Pulmonary effort is normal.     Breath sounds: Normal breath sounds.  Abdominal:     General: Abdomen is flat. Bowel sounds are  normal. There is no distension.     Palpations: Abdomen is soft.     Tenderness: There is no abdominal tenderness.  Musculoskeletal:     Cervical back: Normal range of motion and neck supple.  Lymphadenopathy:     Cervical: No cervical adenopathy.  Skin:    General: Skin is warm and dry.     Capillary Refill: Capillary refill takes less than 2 seconds.     Findings: No rash.  Neurological:     General: No focal deficit present.     Mental Status: She is alert and oriented to person, place, and time.     Cranial Nerves: No cranial nerve deficit.     Coordination: Coordination normal.     Deep Tendon Reflexes: Reflexes normal.  Psychiatric:        Mood and Affect: Mood normal.        Behavior: Behavior normal.          Office Visit from 05/19/2020 in [facility]  PHQ-9 Total Score 16     GAD 7 : Generalized Anxiety Score 06/01/2020 05/25/2019 04/22/2019  Nervous, Anxious, on Edge 2 1 2   Control/stop worrying 2 0 2  Worry too much - different things 2 0 2  Trouble relaxing 2 1 3   Restless 2 0 0  Easily annoyed or irritable 2 1 0  Afraid - awful might happen 2 0 2  Total GAD 7 Score 14 3 11   Anxiety Difficulty Somewhat difficult Not difficult at all Not difficult at all      Assessment/plan: 1. Hypertension, essential, benign Has been above goal last few times. We are going to continue her avapro at 300mg /day and then increase her hctz to 25mg /day. Goal blood pressure discussed. If any light headed/dizziness she is to check pressures. If BP <110/70 we can drop her hctz back down to 12.5mg . f/u in 6 months. Routine lab work today.  - CBC with Differential/Platelet; Future - Lipid panel; Future - TSH; Future - Microalbumin / creatinine urine ratio; Future - COMPLETE METABOLIC PANEL WITH GFR; Future - irbesartan (AVAPRO) 300 MG tablet; Take 1 tablet (300 mg total) by mouth daily.  Dispense: 90 tablet; Refill: 3  2. Mixed hyperlipidemia The 10-year  ASCVD risk score Mikey Bussing DC Jr., et al., 2013) is: 2.1%   3. Vitamin D deficiency, unspecified  - VITAMIN D 25 Hydroxy (Vit-D Deficiency, Fractures); Future  4. Anxiety and depression -GAD7 significantly worsened since we last checked this. I want her to try and take buspar 15mg  in the AM and see how she does since having a hard time taking any medication at school. She can then take a pm dose if needed. Sleeping much better. Hopefully we can get her feet better so she can start some kind of exercise.   5. Encounter for hepatitis C screening test for low risk patient  - Hepatitis C antibody; Future  6. Other osteoarthritis of spine, cervical region Recommended PT, but she doesn't have time at this time. Declines any injections. Will continue conservative therapy with voltaren. Also discussed muscle relaxer, but we are going to wait on this as well.  This visit occurred during the SARS-CoV-2 public health emergency.  Safety protocols were in place, including screening questions prior to the visit, additional usage of staff PPE, and extensive cleaning of exam room while observing appropriate contact time as indicated for disinfecting solutions.    Return in about 6 months (around 11/30/2020) for anxiety .   Orma Flaming, MD [place]   06/01/2020

## 2020-06-01 NOTE — Patient Instructions (Addendum)
-  sent in blood pressure pills -I increased your HCTZ to 25mg /day. Continue the ibersartan as well.  -if you get light headed or dizzy, lose weight, etc. Would drop back down to 12.5mg  -goal bp is <130/80.    For anxiety -try buspar 15mg  in the AM. This may help get through day and you don't need to worry about a second dose. Could then take medication as needed at night if feeling anxious.   -for neck You have DDD. Would recommend PT if you have the time. Continue voltaren gel. Could try a muscle relaxer as well. Just let me know.   Let's see you back in 6 months for anxiety

## 2020-06-02 ENCOUNTER — Ambulatory Visit: Payer: BC Managed Care – PPO | Admitting: Orthopedic Surgery

## 2020-06-02 DIAGNOSIS — M542 Cervicalgia: Secondary | ICD-10-CM

## 2020-06-02 DIAGNOSIS — M9261 Juvenile osteochondrosis of tarsus, right ankle: Secondary | ICD-10-CM | POA: Diagnosis not present

## 2020-06-02 LAB — LIPID PANEL
Cholesterol: 217 mg/dL — ABNORMAL HIGH (ref <200)
HDL: 71 mg/dL (ref >=50)
LDL Cholesterol (Calc): 127 mg/dL (calc) — ABNORMAL HIGH
Non-HDL Cholesterol (Calc): 146 mg/dL (calc) — ABNORMAL HIGH (ref <130)
Total CHOL/HDL Ratio: 3.1 (calc) (ref <5.0)
Triglycerides: 86 mg/dL (ref <150)

## 2020-06-02 LAB — COMPLETE METABOLIC PANEL WITH GFR
AG Ratio: 1.6 (calc) (ref 1.0–2.5)
ALT: 13 U/L (ref 6–29)
AST: 19 U/L (ref 10–35)
Albumin: 4.2 g/dL (ref 3.6–5.1)
Alkaline phosphatase (APISO): 56 U/L (ref 37–153)
BUN: 15 mg/dL (ref 7–25)
CO2: 31 mmol/L (ref 20–32)
Calcium: 9.6 mg/dL (ref 8.6–10.4)
Chloride: 100 mmol/L (ref 98–110)
Creat: 0.77 mg/dL (ref 0.50–1.05)
GFR, Est African American: 102 mL/min/{1.73_m2} (ref >=60)
GFR, Est Non African American: 88 mL/min/{1.73_m2} (ref >=60)
Globulin: 2.6 g/dL (calc) (ref 1.9–3.7)
Glucose, Bld: 101 mg/dL — ABNORMAL HIGH (ref 65–99)
Potassium: 3.7 mmol/L (ref 3.5–5.3)
Sodium: 138 mmol/L (ref 135–146)
Total Bilirubin: 0.4 mg/dL (ref 0.2–1.2)
Total Protein: 6.8 g/dL (ref 6.1–8.1)

## 2020-06-02 LAB — CBC WITH DIFFERENTIAL/PLATELET
Absolute Monocytes: 333 cells/uL (ref 200–950)
Basophils Absolute: 42 cells/uL (ref 0–200)
Basophils Relative: 0.8 %
Eosinophils Absolute: 99 cells/uL (ref 15–500)
Eosinophils Relative: 1.9 %
HCT: 42.2 % (ref 35.0–45.0)
Hemoglobin: 14.6 g/dL (ref 11.7–15.5)
Lymphs Abs: 1596 cells/uL (ref 850–3900)
MCH: 31.7 pg (ref 27.0–33.0)
MCHC: 34.6 g/dL (ref 32.0–36.0)
MCV: 91.5 fL (ref 80.0–100.0)
MPV: 10.1 fL (ref 7.5–12.5)
Monocytes Relative: 6.4 %
Neutro Abs: 3130 cells/uL (ref 1500–7800)
Neutrophils Relative %: 60.2 %
Platelets: 294 10*3/uL (ref 140–400)
RBC: 4.61 10*6/uL (ref 3.80–5.10)
RDW: 12.5 % (ref 11.0–15.0)
Total Lymphocyte: 30.7 %
WBC: 5.2 10*3/uL (ref 3.8–10.8)

## 2020-06-02 LAB — MICROALBUMIN / CREATININE URINE RATIO
Creatinine, Urine: 269 mg/dL (ref 20–275)
Microalb Creat Ratio: 3 mcg/mg creat (ref <30)
Microalb, Ur: 0.8 mg/dL

## 2020-06-02 LAB — VITAMIN D 25 HYDROXY (VIT D DEFICIENCY, FRACTURES): Vit D, 25-Hydroxy: 41 ng/mL (ref 30–100)

## 2020-06-02 LAB — TSH: TSH: 0.83 mIU/L

## 2020-06-02 LAB — HEPATITIS C ANTIBODY
Hepatitis C Ab: NONREACTIVE
SIGNAL TO CUT-OFF: 0.01 (ref <1.00)

## 2020-06-06 ENCOUNTER — Encounter: Payer: Self-pay | Admitting: Orthopedic Surgery
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
## 2020-06-24 ENCOUNTER — Encounter: Payer: Self-pay | Admitting: Family Medicine

## 2020-06-24 ENCOUNTER — Other Ambulatory Visit: Payer: Self-pay | Admitting: Family Medicine

## 2020-06-24 MED ORDER — ESCITALOPRAM OXALATE 10 MG PO TABS: 10.0000 mg | ORAL_TABLET | Freq: Every day | ORAL | 0 refills | Status: DC

## 2020-06-28 ENCOUNTER — Other Ambulatory Visit: Payer: Self-pay | Admitting: Family Medicine

## 2020-06-29 ENCOUNTER — Other Ambulatory Visit: Payer: Self-pay | Admitting: Family Medicine

## 2020-06-29 DIAGNOSIS — I10 Essential (primary) hypertension: Secondary | ICD-10-CM

## 2020-09-14 ENCOUNTER — Other Ambulatory Visit: Payer: Self-pay | Admitting: Family Medicine

## 2020-10-23 ENCOUNTER — Encounter: Payer: Self-pay | Admitting: Family Medicine

## 2020-11-22 ENCOUNTER — Other Ambulatory Visit: Payer: Self-pay | Admitting: Family Medicine

## 2021-01-13 DIAGNOSIS — Z20822 Contact with and (suspected) exposure to covid-19: Secondary | ICD-10-CM | POA: Diagnosis not present

## 2021-02-26 ENCOUNTER — Other Ambulatory Visit: Payer: Self-pay | Admitting: Family Medicine

## 2021-03-06 ENCOUNTER — Other Ambulatory Visit: Payer: Self-pay

## 2021-06-05 ENCOUNTER — Other Ambulatory Visit: Payer: Self-pay

## 2021-06-05 ENCOUNTER — Other Ambulatory Visit: Payer: Self-pay | Admitting: Family Medicine

## 2021-06-12 ENCOUNTER — Other Ambulatory Visit: Payer: Self-pay

## 2021-06-12 ENCOUNTER — Telehealth: Payer: Self-pay

## 2021-06-12 MED ORDER — HYDROCHLOROTHIAZIDE 25 MG PO TABS: 25.0000 mg | ORAL_TABLET | Freq: Every day | ORAL | 0 refills | Status: DC

## 2021-06-12 NOTE — Telephone Encounter (Signed)
LAST APPOINTMENT DATE:  06/01/20  NEXT APPOINTMENT DATE: 07/10/21 TOC  MEDICATION:hydrochlorothiazide (HYDRODIURIL) 25 MG tablet  PHARMACY: [id number] - [place], [address] AT [address]

## 2021-06-12 NOTE — Telephone Encounter (Signed)
Sent 1 month supply to pharmacy.

## 2021-07-10 ENCOUNTER — Encounter: Payer: BC Managed Care – PPO | Admitting: Family

## 2021-07-26 ENCOUNTER — Other Ambulatory Visit: Payer: Self-pay | Admitting: *Deleted

## 2021-07-26 MED ORDER — HYDROCHLOROTHIAZIDE 25 MG PO TABS: 25.0000 mg | ORAL_TABLET | Freq: Every day | ORAL | 0 refills | Status: DC

## 2021-08-04 ENCOUNTER — Ambulatory Visit (INDEPENDENT_AMBULATORY_CARE_PROVIDER_SITE_OTHER): Payer: 59 | Admitting: Family

## 2021-08-04 ENCOUNTER — Other Ambulatory Visit: Payer: Self-pay

## 2021-08-04 ENCOUNTER — Encounter: Payer: Self-pay | Admitting: Family

## 2021-08-04 VITALS — BP 130/75 | HR 74 | Temp 97.6°F | Ht 68.0 in | Wt 207.0 lb

## 2021-08-04 DIAGNOSIS — Z0000 Encounter for general adult medical examination without abnormal findings: Secondary | ICD-10-CM | POA: Diagnosis not present

## 2021-08-04 DIAGNOSIS — L989 Disorder of the skin and subcutaneous tissue, unspecified: Secondary | ICD-10-CM

## 2021-08-04 DIAGNOSIS — Z23 Encounter for immunization: Secondary | ICD-10-CM | POA: Insufficient documentation

## 2021-08-04 DIAGNOSIS — K580 Irritable bowel syndrome with diarrhea: Secondary | ICD-10-CM | POA: Insufficient documentation

## 2021-08-04 HISTORY — DX: Encounter for general adult medical examination without abnormal findings: Z00.00

## 2021-08-04 HISTORY — DX: Encounter for immunization: Z23

## 2021-08-04 LAB — COMPREHENSIVE METABOLIC PANEL
ALT: 21 U/L (ref 0–35)
AST: 20 U/L (ref 0–37)
Albumin: 4.2 g/dL (ref 3.5–5.2)
Alkaline Phosphatase: 47 U/L (ref 39–117)
BUN: 17 mg/dL (ref 6–23)
CO2: 31 mEq/L (ref 19–32)
Calcium: 9.9 mg/dL (ref 8.4–10.5)
Chloride: 103 mEq/L (ref 96–112)
Creatinine, Ser: 0.8 mg/dL (ref 0.40–1.20)
GFR: 83.31 mL/min (ref >60.00)
Glucose, Bld: 110 mg/dL — ABNORMAL HIGH (ref 70–99)
Potassium: 4.7 mEq/L (ref 3.5–5.1)
Sodium: 141 mEq/L (ref 135–145)
Total Bilirubin: 0.5 mg/dL (ref 0.2–1.2)
Total Protein: 7.2 g/dL (ref 6.0–8.3)

## 2021-08-04 LAB — LIPID PANEL
Cholesterol: 224 mg/dL — ABNORMAL HIGH (ref 0–200)
HDL: 82.9 mg/dL (ref >39.00)
LDL Cholesterol: 131 mg/dL — ABNORMAL HIGH (ref 0–99)
NonHDL: 140.9
Total CHOL/HDL Ratio: 3
Triglycerides: 52 mg/dL (ref 0.0–149.0)
VLDL: 10.4 mg/dL (ref 0.0–40.0)

## 2021-08-04 LAB — CBC WITH DIFFERENTIAL/PLATELET
Basophils Absolute: 0 10*3/uL (ref 0.0–0.1)
Basophils Relative: 0.9 % (ref 0.0–3.0)
Eosinophils Absolute: 0.1 10*3/uL (ref 0.0–0.7)
Eosinophils Relative: 2.4 % (ref 0.0–5.0)
HCT: 40.5 % (ref 36.0–46.0)
Hemoglobin: 13.5 g/dL (ref 12.0–15.0)
Lymphocytes Relative: 34.8 % (ref 12.0–46.0)
Lymphs Abs: 1.6 10*3/uL (ref 0.7–4.0)
MCHC: 33.3 g/dL (ref 30.0–36.0)
MCV: 91.9 fl (ref 78.0–100.0)
Monocytes Absolute: 0.3 10*3/uL (ref 0.1–1.0)
Monocytes Relative: 6.2 % (ref 3.0–12.0)
Neutro Abs: 2.6 10*3/uL (ref 1.4–7.7)
Neutrophils Relative %: 55.7 % (ref 43.0–77.0)
Platelets: 262 10*3/uL (ref 150.0–400.0)
RBC: 4.41 Mil/uL (ref 3.87–5.11)
RDW: 13.1 % (ref 11.5–15.5)
WBC: 4.6 10*3/uL (ref 4.0–10.5)

## 2021-08-04 LAB — TSH: TSH: 0.98 u[IU]/mL (ref 0.35–5.50)

## 2021-08-04 NOTE — Patient Instructions (Signed)
It was very nice to meet you today!  Please go to the lab today for blood work. I have sent the dermatology referral for you, they will call you directly for an appointment. Schedule a 41mo follow up appt for refills.  Have a wonderful holiday!  PLEASE NOTE:  If you had any lab tests please let us know if you have not heard back within a few days. You may see your results on MyChart before we have a chance to review them but we will give you a call once they are reviewed by [place]. If we ordered any referrals today, please let us know if you have not heard from their office within the next week.   Please try these tips to maintain a healthy lifestyle:  Eat most of your calories during the day when you are active. Eliminate processed foods including packaged sweets (pies, cakes, cookies), reduce intake of potatoes, white bread, white pasta, and white rice. Look for whole grain options, oat flour or almond flour.  Each meal should contain half fruits/vegetables, one quarter protein, and one quarter carbs (no bigger than a computer mouse).  Cut down on sweet beverages. This includes juice, soda, and sweet tea. Also watch fruit intake, though this is a healthier sweet option, it still contains natural sugar! Limit to 3 servings daily.  Drink at least 1 glass of water with each meal and aim for at least 8 glasses per day  Exercise at least 150 minutes every week.

## 2021-08-04 NOTE — Progress Notes (Signed)
Phone [phone]   Subjective:   Patient is a 55 y.o. female presenting for annual physical.    Chief Complaint  Patient presents with   Annual Exam   Hypertension   Hyperlipidemia   Vitamin D Deficiency    See problem oriented charting- ROS- full  review of systems was completed and negative except for: IBS, skin lesion Skin lesion:  pt reports lesion on left upper arm, has had "a long time" but never assessed, denies any new growth, no pain or itching. Also reports other unusual moles located in different areas of her body that she would like to have further assessed.  Irritable Bowel Syndrome Patient complains of loose stools. Onset of symptoms was  about a year ago . Current symptoms: abdominal bloating: mild, loose stools occurring 3 times per day, short transit time, and anxiety: moderate. Patient denies belching, constipation, flatus, melena, and nausea. Symptoms have been intermittent. Previous visits for this problem: yes, last seen 1 year ago by the patient's PCP. Evaluation to date has been none. Treatment to date has been  herbal supplements and dietary interventions which have been somewhat effective.   The following were reviewed and entered/updated in epic: Past Medical History:  Diagnosis Date   Anxiety    Depression    Gallstones    GERD (gastroesophageal reflux disease)    Hyperlipidemia    Hypertension    Patient Active Problem List   Diagnosis Date Noted   Need for immunization against influenza 08/04/2021   Skin lesion 08/04/2021   Annual physical exam 08/04/2021   Irritable bowel syndrome with diarrhea 08/04/2021   Anxiety and depression 04/22/2019   Primary osteoarthritis of both hands 07/08/2018   Primary osteoarthritis of both knees 07/08/2018   Primary osteoarthritis of both feet 07/08/2018   Myofascial pain 07/08/2018   Polyarthralgia 04/11/2018   Positive ANA (antinuclear antibody) 04/11/2018   Vitamin D deficiency, unspecified 04/09/2018    Hypertension, essential, benign 08/09/2017   Hyperlipidemia 08/09/2017   Past Surgical History:  Procedure Laterality Date   CERVICAL SPINE SURGERY  2011   CESAREAN SECTION  2000, 2002, 2005   x 3   CHOLECYSTECTOMY  2006   COLONOSCOPY  07/07/2018   UPPER GI ENDOSCOPY  07/07/2018    Family History  Problem Relation Age of Onset   Depression Mother    Heart disease Mother    Hypertension Mother    Birth defects Father    Cancer Father        mets   Heart disease Father    Marfan syndrome Father    ADD / ADHD Daughter    Alcohol abuse Daughter    Depression Daughter    Drug abuse Daughter    Learning disabilities Daughter    Allergy (severe) Daughter    Depression Son    Allergy (severe) Son        Eosinophilic esophagitis   Heart disease Maternal Grandmother    Heart attack Maternal Grandmother    Cancer Paternal Grandmother        brain tumor   Early death Paternal Grandmother    Alcohol abuse Paternal Grandfather    Depression Paternal Grandfather    Bladder Cancer Paternal Grandfather    Depression Daughter    Hearing loss Daughter    Celiac disease Daughter     Medications- reviewed and updated Current Outpatient Medications  Medication Sig Dispense Refill   Ascorbic Acid (VITAMIN C PO) Take by mouth. 2000 IU  cetirizine (ZYRTEC) 10 MG tablet Take 1 tablet (10 mg total) by mouth daily. 30 tablet 0   cholecalciferol (VITAMIN D) 1000 units tablet Take 1,000 Units by mouth daily.     hydrochlorothiazide (HYDRODIURIL) 25 MG tablet Take 1 tablet (25 mg total) by mouth daily. 30 tablet 0   irbesartan (AVAPRO) 300 MG tablet TAKE 1 TABLET(300 MG) BY MOUTH DAILY 90 tablet 3   milk thistle 175 MG tablet Take 175 mg by mouth daily.     UNABLE TO FIND Provitalize: Natural Probiotic and weightmanagement     Zinc 50 MG TABS Take by mouth.     B Complex-C (SUPER B COMPLEX PO) Take by mouth. (Patient not taking: Reported on 08/04/2021)     busPIRone (BUSPAR) 7.5 MG tablet  Take 1 tablet (7.5 mg total) by mouth 3 (three) times daily. (Patient not taking: Reported on 08/04/2021) 90 tablet 0   escitalopram (LEXAPRO) 10 MG tablet TAKE 1 TABLET(10 MG) BY MOUTH DAILY (Patient not taking: Reported on 08/04/2021) 90 tablet 0   Multiple Vitamins-Minerals (MULTIVITAMIN ADULT PO) Take by mouth daily. (Patient not taking: Reported on 08/04/2021)     Vitamin D, Ergocalciferol, (DRISDOL) 1.25 MG (50000 UT) CAPS capsule One capsule by mouth once a week for 8 weeks. Then take 2000IU/day (Patient not taking: Reported on 05/19/2020) 8 capsule 0   Current Facility-Administered Medications  Medication Dose Route Frequency Provider Last Rate Last Admin   0.9 %  sodium chloride infusion  500 mL Intravenous Once Nandigam, Venia Minks, MD        Allergies-reviewed and updated Allergies  Allergen Reactions   Latex     Social History   Social History Narrative   Not on file   Objective  Objective:  BP 130/75   Pulse 74   Temp 97.6 F (36.4 C) (Temporal)   Ht 5\' 8"  (1.727 m)   Wt 207 lb (93.9 kg)   SpO2 97%   BMI 31.47 kg/m  Gen: NAD, resting comfortably HEENT: Mucous membranes are moist. Oropharynx normal Neck: no thyromegaly CV: RRR no murmurs rubs or gallops Lungs: CTAB no crackles, wheeze, rhonchi Abdomen: soft/nontender/nondistended/normal bowel sounds. No rebound or guarding.  Ext: no edema Skin: warm, dry Neuro: grossly normal, moves all extremities, PERRLA   Assessment and Plan   Health Maintenance counseling: 1. Anticipatory guidance: Patient counseled regarding regular dental exams q6 months, eye exams,  avoiding smoking and second hand smoke, limiting alcohol to 1 beverage per day, no illicit drugs.   2. Risk factor reduction:  Advised patient of need for regular exercise and diet rich and fruits and vegetables to reduce risk of heart attack and stroke. Exercise- runs several days per week.  Wt Readings from Last 3 Encounters:  08/04/21 207 lb (93.9 kg)   06/01/20 199 lb 12.8 oz (90.6 kg)  05/19/20 199 lb 9.6 oz (90.5 kg)   3. Immunizations/screenings/ancillary studies Immunization History  Administered Date(s) Administered   Influenza,inj,Quad PF,6+ Mos 08/09/2017, 04/22/2019, 06/01/2020, 08/04/2021   Influenza-Unspecified 05/27/2018   Moderna Sars-Covid-2 Vaccination 10/27/2019, 11/25/2019, 07/03/2020, 12/31/2020   PFIZER SARS-COV-2 Pediatric Vaccination 5-41yrs 05/21/2021   Health Maintenance Due  Topic Date Due   Pneumococcal Vaccine 50-24 Years old (1 - PCV) Never done   Zoster Vaccines- Shingrix (1 of 2) Never done   MAMMOGRAM  10/18/2016   COVID-19 Vaccine (5 - Booster for Moderna series) 07/16/2021    4. Cervical cancer screening- 2020 5. Breast cancer screening-  mammogram 2018- ordered today 6.  Colon cancer screening - 2019 7. Skin cancer screening- advised regular sunscreen use. Denies worrisome, changing, or new skin lesions.  8. Birth control/STD check- N/A 9. Osteoporosis screening- never, ordered today 10. Smoking associated screening - non- smoker  Problem List Items Addressed This Visit       Digestive   Irritable bowel syndrome with diarrhea    Pt is dealing with a lot of stress r/t dtr and ex-husb with addiction problems, has 2 other children, one at home in HS. Pt feels her IBS sx are exacerbated with stress. Advised on dietary and medication options. Pt is trying an OTC herbal supplement currently that seems to be helping.        Musculoskeletal and Integument   Skin lesion    Left lateral upper arm, pt also has multiple other lesions diffuse on back, legs, that she would like to have assessed, sending DERM referral.      Relevant Orders   Ambulatory referral to Dermatology     Other   Need for immunization against influenza - Primary   Relevant Orders   Flu Vaccine QUAD 61mo+IM (Fluarix, Fluzone & Alfiuria Quad PF) (Completed)   Annual physical exam   Relevant Orders   MM Digital Screening    Comprehensive metabolic panel   TSH   Lipid panel   CBC with Differential/Platelet   DG BONE DENSITY (DXA)    Recommended follow up: Return in about 6 months (around 02/02/2022) for HTN, Lipids, refills. No future appointments.  Lab/Order associations:non- fasting   ICD-10-CM   1. Need for immunization against influenza  Z23 Flu Vaccine QUAD 68mo+IM (Fluarix, Fluzone & Alfiuria Quad PF)    2. Annual physical exam  Z00.00 MM Digital Screening    Comprehensive metabolic panel    TSH    Lipid panel    CBC with Differential/Platelet    DG BONE DENSITY (DXA)    3. Irritable bowel syndrome with diarrhea  K58.0     4. Skin lesion  L98.9 Ambulatory referral to Dermatology       Jeanie Sewer, NP

## 2021-08-04 NOTE — Assessment & Plan Note (Addendum)
Left lateral upper arm, pt also has multiple other lesions diffuse on back, legs, that she would like to have assessed, sending DERM referral.

## 2021-08-04 NOTE — Assessment & Plan Note (Signed)
Pt is dealing with a lot of stress r/t dtr and ex-husb with addiction problems, has 2 other children, one at home in HS. Pt feels her IBS sx are exacerbated with stress. Advised on dietary and medication options. Pt is trying an OTC herbal supplement currently that seems to be helping.

## 2021-08-06 NOTE — Progress Notes (Signed)
All labs look good, just blood glucose slightly elevated which is ok if not fasting.  Also Total cholesterol number & LDL (bad #) are high. Must try to cut back on any junk food and fried foods and make therapeutic lifestyle changes to decrease alcohol, nonnutritional snacks e.g. chips/cookies,pies, cakes and candies and increase fruits/vegetables/fiber. Minimize fruit intake (still has sugar!) and avoid fried foods, fatty meat (red meat), high fat dairy foods, including cheese, milk, ice cream.  Continue/restart an exercise routine, shooting for 20min 5-7days per week.   Repeat fasting lipids (no food or drink except black coffee or water after midnight) in 3-6 months with an office visit.

## 2021-08-08 ENCOUNTER — Encounter: Payer: Self-pay | Admitting: Family

## 2021-08-08 ENCOUNTER — Other Ambulatory Visit: Payer: Self-pay | Admitting: Family

## 2021-08-08 DIAGNOSIS — Z1231 Encounter for screening mammogram for malignant neoplasm of breast: Secondary | ICD-10-CM

## 2021-08-08 NOTE — Telephone Encounter (Signed)
Sent lab message in My Chart.

## 2021-08-20 ENCOUNTER — Other Ambulatory Visit: Payer: Self-pay | Admitting: Family Medicine

## 2021-08-20 DIAGNOSIS — I10 Essential (primary) hypertension: Secondary | ICD-10-CM

## 2021-08-31 ENCOUNTER — Other Ambulatory Visit: Payer: Self-pay

## 2021-08-31 MED ORDER — HYDROCHLOROTHIAZIDE 25 MG PO TABS: 25.0000 mg | ORAL_TABLET | Freq: Every day | ORAL | 1 refills | Status: DC

## 2021-09-12 ENCOUNTER — Ambulatory Visit
Admission: RE | Admit: 2021-09-12 | Discharge: 2021-09-12 | Disposition: A | Discharge status: Home / Self Care | Payer: 59 | Source: Ambulatory Visit

## 2021-09-12 DIAGNOSIS — Z1231 Encounter for screening mammogram for malignant neoplasm of breast: Secondary | ICD-10-CM

## 2021-11-14 ENCOUNTER — Ambulatory Visit (INDEPENDENT_AMBULATORY_CARE_PROVIDER_SITE_OTHER): Payer: 59 | Admitting: Family Medicine

## 2021-11-14 ENCOUNTER — Encounter: Payer: Self-pay | Admitting: Family Medicine

## 2021-11-14 VITALS — BP 120/82 | HR 64 | Temp 98.0°F | Ht 68.0 in | Wt 195.0 lb

## 2021-11-14 DIAGNOSIS — R300 Dysuria: Secondary | ICD-10-CM | POA: Diagnosis not present

## 2021-11-14 DIAGNOSIS — N3001 Acute cystitis with hematuria: Secondary | ICD-10-CM | POA: Diagnosis not present

## 2021-11-14 LAB — POCT URINALYSIS DIPSTICK
Bilirubin, UA: NEGATIVE
Blood, UA: POSITIVE
Glucose, UA: NEGATIVE
Ketones, UA: NEGATIVE
Nitrite, UA: NEGATIVE
Protein, UA: POSITIVE — AB
Spec Grav, UA: 1.02 (ref 1.010–1.025)
Urobilinogen, UA: 0.2 E.U./dL
pH, UA: 6.5 (ref 5.0–8.0)

## 2021-11-14 MED ORDER — PHENAZOPYRIDINE HCL 100 MG PO TABS: 100.0000 mg | ORAL_TABLET | Freq: Three times a day (TID) | ORAL | 0 refills | Status: DC | PRN

## 2021-11-14 MED ORDER — SULFAMETHOXAZOLE-TRIMETHOPRIM 800-160 MG PO TABS: 1.0000 | ORAL_TABLET | Freq: Two times a day (BID) | ORAL | 0 refills | Status: DC

## 2021-11-14 NOTE — Progress Notes (Signed)
? ?Subjective:  ? ? ? Patient ID: Angela Everett, female    DOB: 04-22-1966, 56 y.o.   MRN: [id number] ? ?Chief Complaint  ?Patient presents with  ? Abdominal Pain  ?  Lower abdominal pain that started yesterday ?  ? Hematuria  ?  Noticed this morning  ? Dysuria  ?  Started yesterday ?  ? ? ?HPI ?Poss UTI ?Since yesterday, dysuria.  Bad this am and some blood.  Lower abd pressure.  Some urgency and sens to go.  Some chills.  No LBP.   Recent intercourse ?No f/n/v.   ? ? ?Health Maintenance Due  ?Topic Date Due  ? Zoster Vaccines- Shingrix (1 of 2) Never done  ? ? ?Past Medical History:  ?Diagnosis Date  ? Anxiety   ? Depression   ? Gallstones   ? GERD (gastroesophageal reflux disease)   ? Hyperlipidemia   ? Hypertension   ? ? ?Past Surgical History:  ?Procedure Laterality Date  ? CERVICAL SPINE SURGERY  2011  ? CESAREAN SECTION  2000, 2002, 2005  ? x 3  ? CHOLECYSTECTOMY  2006  ? COLONOSCOPY  07/07/2018  ? UPPER GI ENDOSCOPY  07/07/2018  ? ? ?Outpatient Medications Prior to Visit  ?Medication Sig Dispense Refill  ? Ascorbic Acid (VITAMIN C PO) Take by mouth. 2000 IU    ? B Complex-C (SUPER B COMPLEX PO) Take by mouth.    ? cetirizine (ZYRTEC) 10 MG tablet Take 1 tablet (10 mg total) by mouth daily. 30 tablet 0  ? cholecalciferol (VITAMIN D) 1000 units tablet Take 1,000 Units by mouth daily.    ? hydrochlorothiazide (HYDRODIURIL) 25 MG tablet Take 1 tablet (25 mg total) by mouth daily. 90 tablet 1  ? irbesartan (AVAPRO) 300 MG tablet TAKE 1 TABLET(300 MG) BY MOUTH DAILY 90 tablet 3  ? milk thistle 175 MG tablet Take 175 mg by mouth daily.    ? UNABLE TO FIND Provitalize: Natural Probiotic and weightmanagement    ? Zinc 50 MG TABS Take by mouth.    ? Multiple Vitamins-Minerals (MULTIVITAMIN ADULT PO) Take by mouth daily.    ? busPIRone (BUSPAR) 7.5 MG tablet Take 1 tablet (7.5 mg total) by mouth 3 (three) times daily. (Patient not taking: Reported on 08/04/2021) 90 tablet 0  ? escitalopram (LEXAPRO) 10 MG  tablet TAKE 1 TABLET(10 MG) BY MOUTH DAILY (Patient not taking: Reported on 08/04/2021) 90 tablet 0  ? Vitamin D, Ergocalciferol, (DRISDOL) 1.25 MG (50000 UT) CAPS capsule One capsule by mouth once a week for 8 weeks. Then take 2000IU/day (Patient not taking: Reported on 05/19/2020) 8 capsule 0  ? ?Facility-Administered Medications Prior to Visit  ?Medication Dose Route Frequency Provider Last Rate Last Admin  ? 0.9 %  sodium chloride infusion  500 mL Intravenous Once Nandigam, Venia Minks, MD      ? ? ?Allergies  ?Allergen Reactions  ? Latex   ? Diflucan [Fluconazole] Rash  ? ?ROS neg/noncontributory except as noted HPI/below ?Few days ago-dizzy when bend/stand up ? ? ?   ?Objective:  ?  ? ?BP 120/82   Pulse 64   Temp 98 ?F (36.7 ?C) (Temporal)   Ht '5\' 8"'$  (1.727 m)   Wt 195 lb (88.5 kg)   SpO2 99%   BMI 29.65 kg/m?  ?Wt Readings from Last 3 Encounters:  ?11/14/21 195 lb (88.5 kg)  ?08/04/21 207 lb (93.9 kg)  ?06/01/20 199 lb 12.8 oz (90.6 kg)  ? ? ?Physical Exam  ? ?  Gen: WDWN NAD.  wf ?HEENT: NCAT, conjunctiva not injected, sclera nonicteric ?NECK:  supple, no thyromegaly, no nodes, no carotid bruits ?CARDIAC: RRR, S1S2+, no murmur. DP 2+B ?LUNGS: CTAB. No wheezes ?ABDOMEN:  BS+, soft, NTND, No HSM, no masses.  No CVAT ?EXT:  no edema ?MSK: no gross abnormalities.  ?NEURO: A&O x3.  CN II-XII intact.  ?PSYCH: normal mood. Good eye contact ? ?Results for orders placed or performed in visit on 11/14/21  ?POCT urinalysis dipstick  ?Result Value Ref Range  ? Color, UA yellow   ? Clarity, UA clear   ? Glucose, UA Negative Negative  ? Bilirubin, UA negative   ? Ketones, UA negative   ? Spec Grav, UA 1.020 1.010 - 1.025  ? Blood, UA positive   ? pH, UA 6.5 5.0 - 8.0  ? Protein, UA Positive (A) Negative  ? Urobilinogen, UA 0.2 0.2 or 1.0 E.U./dL  ? Nitrite, UA negative   ? Leukocytes, UA Large (3+) (A) Negative  ? Appearance    ? Odor    ?  ? ?   ?Assessment & Plan:  ? ?Problem List Items Addressed This Visit    ?None ?Visit Diagnoses   ? ? Dysuria    -  Primary  ? Relevant Orders  ? POCT urinalysis dipstick (Completed)  ? Urine Culture  ? Acute cystitis with hematuria      ? ?  ? UTI w/hematuria-check cx/  hygiene discussed.  Bactrim and pyridium ?Dizziness- ? ? ?Meds ordered this encounter  ?Medications  ? phenazopyridine (PYRIDIUM) 100 MG tablet  ?  Sig: Take 1 tablet (100 mg total) by mouth 3 (three) times daily as needed for pain.  ?  Dispense:  6 tablet  ?  Refill:  0  ? sulfamethoxazole-trimethoprim (BACTRIM DS) 800-160 MG tablet  ?  Sig: Take 1 tablet by mouth 2 (two) times daily.  ?  Dispense:  6 tablet  ?  Refill:  0  ? ? ?Wellington Hampshire, MD ? ?

## 2021-11-14 NOTE — Patient Instructions (Signed)
An antibiotic has been sent to your pharmacy, start today. ?We will call or notify you via MyChart if the urine culture indicates a different antibiotic to be used. ?Drink at least 2 liters = 64oz = 8 cups of water daily.  ?Urinate after intercourse ?

## 2021-11-16 LAB — URINE CULTURE
MICRO NUMBER:: 13158538
SPECIMEN QUALITY:: ADEQUATE

## 2021-11-20 ENCOUNTER — Telehealth: Payer: Self-pay | Admitting: Family

## 2021-11-20 NOTE — Telephone Encounter (Signed)
Dr Cherlynn Kaiser saw her for acute visit - what symptoms is she specifically still having?  The culture sent out last time indicated the antibiotic prescribed should have worked, but if mostly urinary sx still, I can send another round of Bactrim.  ?Just let me know what she says, thx

## 2021-11-20 NOTE — Telephone Encounter (Signed)
Patient stated she finished antibiotic, she is still not feeling any better. Patient wants to know what to do. Please call her back .-   ?

## 2021-11-21 ENCOUNTER — Other Ambulatory Visit: Payer: Self-pay | Admitting: Family Medicine

## 2021-11-21 MED ORDER — SULFAMETHOXAZOLE-TRIMETHOPRIM 800-160 MG PO TABS: 1.0000 | ORAL_TABLET | Freq: Two times a day (BID) | ORAL | 0 refills | Status: DC

## 2021-11-21 NOTE — Telephone Encounter (Signed)
Left message to return my call.  

## 2021-11-21 NOTE — Telephone Encounter (Signed)
Patient stated she is still having some burning/stinging, but has improved. She has finished her antibiotics. Patient would like to know what her next steps would be. Please advise.  ?

## 2021-11-21 NOTE — Telephone Encounter (Signed)
Patient notified and verbalized understanding. 

## 2022-01-01 ENCOUNTER — Encounter: Payer: Self-pay | Admitting: Family

## 2022-01-01 ENCOUNTER — Ambulatory Visit (INDEPENDENT_AMBULATORY_CARE_PROVIDER_SITE_OTHER): Payer: 59 | Admitting: Family

## 2022-01-01 VITALS — BP 126/86 | HR 84 | Temp 98.7°F | Ht 68.0 in | Wt 192.1 lb

## 2022-01-01 DIAGNOSIS — Z111 Encounter for screening for respiratory tuberculosis: Secondary | ICD-10-CM | POA: Diagnosis not present

## 2022-01-01 DIAGNOSIS — R350 Frequency of micturition: Secondary | ICD-10-CM

## 2022-01-01 LAB — POCT URINALYSIS DIPSTICK
Bilirubin, UA: NEGATIVE
Blood, UA: NEGATIVE
Glucose, UA: NEGATIVE
Ketones, UA: NEGATIVE
Leukocytes, UA: NEGATIVE
Nitrite, UA: NEGATIVE
Protein, UA: POSITIVE — AB
Spec Grav, UA: 1.025 (ref 1.010–1.025)
Urobilinogen, UA: 0.2 E.U./dL
pH, UA: 6 (ref 5.0–8.0)

## 2022-01-01 NOTE — Progress Notes (Signed)
? ?Subjective:  ? ? ? Patient ID: Angela Everett, female    DOB: 03/28/1966, 56 y.o.   MRN: [id number] ? ?Chief Complaint  ?Patient presents with  ? Dysuria  ?  Pt burning during urination and pelvic pain. Present since 5/7. Has not tried anything for it.   ? PPD Reading  ?  Tb skin test  ? ?HPI: ?Urinary symptoms: Patient c/o  dysuria, frequency, flank pain, pelvic pain, low back pai. Other sx:  no vaginal d/c, vaginal itching none. Duration of sx: 3 weeks; Home tx: none; Denies nausea, fever. Reports last UTI a few months ago.  ? ? ?Assessment & Plan:  ? ?Problem List Items Addressed This Visit   ?None ?Visit Diagnoses   ? ? Urinary frequency    -  Primary  ? with dysuria, UA neg. pt had UTI not long ago that was difficult to treat, reports more vaginal irritation today, burning after urination, advised pt sounds more like yeast, reports more sexual intercourse recently than usual, which can irritate the skin. Advised trying OTC vaginal yeast cream, and vaginal lubricant with every sexual encounter.  ? ?Relevant Orders  ? POCT Urinalysis Dipstick (Completed)  ? Encounter for screening for respiratory tuberculosis      ? 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 ? ?Relevant Orders  ? QuantiFERON-TB Gold Plus  ? ?  ? ?Outpatient Medications Prior to Visit  ?Medication Sig Dispense Refill  ? Ascorbic Acid (VITAMIN C PO) Take by mouth. 2000 IU    ? B Complex-C (SUPER B COMPLEX PO) Take by mouth.    ? cetirizine (ZYRTEC) 10 MG tablet Take 1 tablet (10 mg total) by mouth daily. 30 tablet 0  ? cholecalciferol (VITAMIN D) 1000 units tablet Take 1,000 Units by mouth daily.    ? hydrochlorothiazide (HYDRODIURIL) 25 MG tablet Take 1 tablet (25 mg total) by mouth daily. 90 tablet 1  ? irbesartan (AVAPRO) 300 MG tablet TAKE 1 TABLET(300 MG) BY MOUTH DAILY 90 tablet 3  ? milk thistle 175 MG tablet Take 175 mg by mouth daily.    ? phenazopyridine (PYRIDIUM)  100 MG tablet Take 1 tablet (100 mg total) by mouth 3 (three) times daily as needed for pain. 6 tablet 0  ? UNABLE TO FIND Provitalize: Natural Probiotic and weightmanagement    ? Zinc 50 MG TABS Take by mouth.    ? sulfamethoxazole-trimethoprim (BACTRIM DS) 800-160 MG tablet Take 1 tablet by mouth 2 (two) times daily. 8 tablet 0  ? ?Facility-Administered Medications Prior to Visit  ?Medication Dose Route Frequency Provider Last Rate Last Admin  ? 0.9 %  sodium chloride infusion  500 mL Intravenous Once Nandigam, Venia Minks, MD      ? ? ?Past Medical History:  ?Diagnosis Date  ? Anxiety   ? Depression   ? Gallstones   ? GERD (gastroesophageal reflux disease)   ? Hyperlipidemia   ? Hypertension   ? ? ?Past Surgical History:  ?Procedure Laterality Date  ? CERVICAL SPINE SURGERY  2011  ? CESAREAN SECTION  2000, 2002, 2005  ? x 3  ? CHOLECYSTECTOMY  2006  ? COLONOSCOPY  07/07/2018  ? UPPER GI ENDOSCOPY  07/07/2018  ? ? ?Allergies  ?Allergen Reactions  ? Latex   ? Diflucan [Fluconazole] Rash  ? ? ?   ?Objective:  ?  ?Physical Exam ?Vitals and nursing note reviewed.  ?Constitutional:   ?  Appearance: Normal appearance.  ?Cardiovascular:  ?   Rate and Rhythm: Normal rate and regular rhythm.  ?Pulmonary:  ?   Effort: Pulmonary effort is normal.  ?   Breath sounds: Normal breath sounds.  ?Musculoskeletal:     ?   General: Normal range of motion.  ?Skin: ?   General: Skin is warm and dry.  ?Neurological:  ?   Mental Status: She is alert.  ?Psychiatric:     ?   Mood and Affect: Mood normal.     ?   Behavior: Behavior normal.  ? ? ?BP 126/86 (BP Location: Left Arm, Patient Position: Sitting, Cuff Size: Large)   Pulse 84   Temp 98.7 ?F (37.1 ?C) (Temporal)   Ht '5\' 8"'$  (1.727 m)   Wt 192 lb 2 oz (87.1 kg)   SpO2 100%   BMI 29.21 kg/m?  ?Wt Readings from Last 3 Encounters:  ?01/01/22 192 lb 2 oz (87.1 kg)  ?11/14/21 195 lb (88.5 kg)  ?08/04/21 207 lb (93.9 kg)  ? ? ? Jeanie Sewer, NP ? ?

## 2022-01-03 LAB — QUANTIFERON-TB GOLD PLUS
Mitogen-NIL: >10 IU/mL
NIL: 0.05 IU/mL
QuantiFERON-TB Gold Plus: NEGATIVE
TB1-NIL: <0 IU/mL
TB2-NIL: <0 IU/mL

## 2022-02-20 ENCOUNTER — Encounter: Payer: Self-pay | Admitting: Dermatology

## 2022-02-20 ENCOUNTER — Ambulatory Visit: Payer: 59 | Admitting: Dermatology

## 2022-02-20 DIAGNOSIS — D2362 Other benign neoplasm of skin of left upper limb, including shoulder: Secondary | ICD-10-CM | POA: Diagnosis not present

## 2022-02-20 DIAGNOSIS — Z1283 Encounter for screening for malignant neoplasm of skin: Secondary | ICD-10-CM

## 2022-02-20 DIAGNOSIS — D239 Other benign neoplasm of skin, unspecified: Secondary | ICD-10-CM

## 2022-03-18 ENCOUNTER — Encounter: Payer: Self-pay | Admitting: Dermatology

## 2022-03-18 NOTE — Progress Notes (Signed)
   New Patient   Subjective  Angela Everett is a 56 y.o. female who presents for the following: Annual Exam (Skin check left upper arm x years, no personal history of bcc or scc).  General skin check, concern with spot on left upper arm Location:  Duration:  Quality:  Associated Signs/Symptoms: Modifying Factors:  Severity:  Timing: Context:    The following portions of the chart were reviewed this encounter and updated as appropriate:  Tobacco  Allergies  Meds  Problems  Med Hx  Surg Hx  Fam Hx      Objective  Well appearing patient in no apparent distress; mood and affect are within normal limits. General skin check: No atypical nevi (all checked with dermoscopy), or signs of NMSC noted at the time of the visit. Facial SGHP, left upper arm DF, Seb k torso and legs, angiomas on the torso, right under arm pigmented tag   Left Shoulder - Posterior 5 mm firm pink dermal papule, compatible dermoscopy    A full examination was performed including scalp, head, eyes, ears, nose, lips, neck, chest, axillae, abdomen, back, buttocks, bilateral upper extremities, bilateral lower extremities, hands, feet, fingers, toes, fingernails, and toenails. All findings within normal limits unless otherwise noted below.  Is beneath undergarments not fully examined.   Assessment & Plan  Screening exam for skin cancer   Annual skin examination l left upper arm  Dermatofibroma is a commonly occurring cutaneous entity usually centered within the skin's dermis. Dermatofibromas are referred to as benign fibrous histiocytomas of the skin, superficial/cutaneous benign fibrous histiocytomas, or common fibrous histiocytoma.  Angiomas RED Dots  Seb K see attached tan lesions also safe to leave  Dermatofibroma Left Shoulder - Posterior  Leave if stable

## 2022-05-04 ENCOUNTER — Telehealth: Payer: Self-pay | Admitting: Family

## 2022-05-04 NOTE — Telephone Encounter (Signed)
Patient requests a TOC from [facility] to [employer]. Please advise.

## 2022-05-04 NOTE — Telephone Encounter (Signed)
fine by me, thx.

## 2022-05-15 NOTE — Telephone Encounter (Signed)
Patient is scheduled for TOC on 06/13/22

## 2022-05-16 ENCOUNTER — Encounter: Payer: Self-pay | Admitting: Family

## 2022-05-16 ENCOUNTER — Telehealth (INDEPENDENT_AMBULATORY_CARE_PROVIDER_SITE_OTHER): Payer: 59 | Admitting: Family

## 2022-05-16 VITALS — Ht 68.0 in | Wt 192.0 lb

## 2022-05-16 DIAGNOSIS — U071 COVID-19: Secondary | ICD-10-CM | POA: Diagnosis not present

## 2022-05-16 NOTE — Progress Notes (Signed)
MyChart Video Visit    Virtual Visit via Video Note   This format is felt to be most appropriate for this patient at this time. Physical exam was limited by quality of the video and audio technology used for the visit. CMA was able to get the patient set up on a video visit.  Patient location: Home. Patient and provider in visit Provider location: Office  I discussed the limitations of evaluation and management by telemedicine and the availability of in person appointments. The patient expressed understanding and agreed to proceed.  Visit Date: 05/16/2022  Today's healthcare provider: Jeanie Sewer, NP     Subjective:   Patient ID: Angela Everett, female    DOB: Aug 31, 1965, 56 y.o.   MRN: [id number]  Chief Complaint  Patient presents with   Covid Positive    Covid positive since 9/15 but Symptoms started on 9/16. Fever of 101.0 from Friday-Sunday, no taste or smell, headache, pain and achy feeling in feet, cough(mucus-clear). Pt Has tried advil which did help the headache.    HPI Covid positive:  Covid positive since 9/15 but sx started on 9/16. Fever of 101.0 from Friday-Sunday, no taste or smell, headache, pain, neuropathy, and achy feeling in feet, cough (mucus-clear). Pt Has tried advil which did help the headache.  Assessment & Plan:  1. COVID-19 - pt is on day 5 of sx, states she was not aware of antiviral meds available to anyone and now I do not believe would be beneficial. Advised of CDC guidelines for masking if out in public. OK to continue taking OTC sinus or pain meds. Encouraged to monitor & notify office of any worsening symptoms: increased shortness of breath, weakness, and signs of dehydration. Instructed to hydrate well, continue up to '600mg'$  of Advil tid for headaches, aches, fever. Advised to let me know if pain/neuropathy in feet does not resolve after another 2-3 weeks. Ok to return to work tomorrow or Friday if feeling better.  Past Medical  History:  Diagnosis Date   Anxiety    Depression    Gallstones    GERD (gastroesophageal reflux disease)    Hyperlipidemia    Hypertension     Past Surgical History:  Procedure Laterality Date   CERVICAL SPINE SURGERY  2011   CESAREAN SECTION  2000, 2002, 2005   x 3   CHOLECYSTECTOMY  2006   COLONOSCOPY  07/07/2018   UPPER GI ENDOSCOPY  07/07/2018    Outpatient Medications Prior to Visit  Medication Sig Dispense Refill   Ascorbic Acid (VITAMIN C PO) Take by mouth. 2000 IU     B Complex-C (SUPER B COMPLEX PO) Take by mouth.     cetirizine (ZYRTEC) 10 MG tablet Take 1 tablet (10 mg total) by mouth daily. 30 tablet 0   cholecalciferol (VITAMIN D) 1000 units tablet Take 1,000 Units by mouth daily.     hydrochlorothiazide (HYDRODIURIL) 25 MG tablet Take 1 tablet (25 mg total) by mouth daily. 90 tablet 1   irbesartan (AVAPRO) 300 MG tablet TAKE 1 TABLET(300 MG) BY MOUTH DAILY 90 tablet 3   milk thistle 175 MG tablet Take 175 mg by mouth daily.     phenazopyridine (PYRIDIUM) 100 MG tablet Take 1 tablet (100 mg total) by mouth 3 (three) times daily as needed for pain. 6 tablet 0   UNABLE TO FIND Provitalize: Natural Probiotic and weightmanagement     Zinc 50 MG TABS Take by mouth.     Facility-Administered Medications Prior  to Visit  Medication Dose Route Frequency Provider Last Rate Last Admin   0.9 %  sodium chloride infusion  500 mL Intravenous Once Nandigam, Venia Minks, MD        Allergies  Allergen Reactions   Latex    Diflucan [Fluconazole] Rash       Objective:   Physical Exam Vitals and nursing note reviewed.  Constitutional:      General: She is not in acute distress.    Appearance: Normal appearance.  HENT:     Head: Normocephalic.  Pulmonary:     Effort: No respiratory distress.  Musculoskeletal:     Cervical back: Normal range of motion.  Skin:    General: Skin is dry.     Coloration: Skin is not pale.  Neurological:     Mental Status: She is alert and  oriented to person, place, and time.  Psychiatric:        Mood and Affect: Mood normal.   Ht '5\' 8"'$  (1.727 m)   Wt 192 lb (87.1 kg)   BMI 29.19 kg/m   Wt Readings from Last 3 Encounters:  05/16/22 192 lb (87.1 kg)  01/01/22 192 lb 2 oz (87.1 kg)  11/14/21 195 lb (88.5 kg)      I discussed the assessment and treatment plan with the patient. The patient was provided an opportunity to ask questions and all were answered. The patient agreed with the plan and demonstrated an understanding of the instructions.   The patient was advised to call back or seek an in-person evaluation if the symptoms worsen or if the condition fails to improve as anticipated.  Jeanie Sewer, NP [place] [phone] (phone) [phone] (fax)  [place]

## 2022-05-21 ENCOUNTER — Encounter: Payer: Self-pay | Admitting: *Deleted

## 2022-05-31 ENCOUNTER — Encounter: Payer: Self-pay | Admitting: Family

## 2022-05-31 ENCOUNTER — Ambulatory Visit: Payer: BC Managed Care – PPO | Admitting: Family

## 2022-05-31 VITALS — BP 108/75 | HR 99 | Temp 97.3°F | Ht 68.0 in | Wt 190.0 lb

## 2022-05-31 DIAGNOSIS — R400 Somnolence: Secondary | ICD-10-CM

## 2022-05-31 DIAGNOSIS — U099 Post covid-19 condition, unspecified: Secondary | ICD-10-CM | POA: Diagnosis not present

## 2022-05-31 MED ORDER — METHYLPREDNISOLONE ACETATE 80 MG/ML IJ SUSP
80.0000 mg | Freq: Once | INTRAMUSCULAR | Status: AC
  Administered 2022-05-31: 80 mg via INTRAMUSCULAR

## 2022-05-31 NOTE — Patient Instructions (Addendum)
It was very nice to see you today!   Stop taking the HCTZ for the next week, as it could be dehydrating you and/or lowering your blood pressure making you more tired.  Hopefully the steroid injection we gave you today will help get you over this "hump" with your continued symptoms.  OK to take 2 generic Benadryl to help with sleep for the next few nights.  Let me know if your toe neuropathy or diarrhea are not improving.  I have sent the sleep study referral, they will call you directly.  Drink at least 2 liters of water every day!  Don't forget to return for your flu vaccine when feeling better!         PLEASE NOTE:  If you had any lab tests please let us know if you have not heard back within a few days. You may see your results on MyChart before we have a chance to review them but we will give you a call once they are reviewed by [place]. If we ordered any referrals today, please let us know if you have not heard from their office within the next week.

## 2022-05-31 NOTE — Progress Notes (Signed)
Patient ID: Angela Everett, female    DOB: 1965-12-23, 56 y.o.   MRN: [id number]  Chief Complaint  Patient presents with   Follow-up    Pt c/o post covid symptoms of pain/numbness in feet, headache once or twice a day, abdominal pains with loose bowel movements and extremely nauseated. Tried eating light and uses ibuprofen for the headache. Negative for covid on 9/27.    HPI:      Multiple sx post covid:   positive on 9/15, had virtual visit, but too late for antiviral. She tested neg on 9/27. But reports having continued fatigue, difficulty sleeping, falling asleep ok, but wakes up and can't get back to sleep, pt snores & her BF reports her gasping during night & she has a hard time staying awake during the day. Has had intermittent neuropathy in legs, mainly bilateral toes, but since being ill her sx have persisted. loose stools about 1-2x/day, w/ crampy pain, occasional nausea, loss of taste & smell which possibly is returning somewhat today she thinks.      Assessment & Plan:  1. Daytime somnolence with snoring, witnessed apneic episodes.  - Ambulatory referral to Sleep Studies  2. Post covid-19 condition, unspecified - giving steroid injection in office, advised to take generic Benadryl for next few nights, hydrate with at least 2 liters water qd. Advised to stop taking her HCTZ for the next week, BP today on lower end, concerned she may be mildly dehydrated with not drinking as much & medication. Monitor mild diarrhea & neuropathy sx, let me know if still persisting in another week.  - methylPREDNISolone acetate (DEPO-MEDROL) injection 80 mg   Subjective:    Outpatient Medications Prior to Visit  Medication Sig Dispense Refill   Ascorbic Acid (VITAMIN C PO) Take by mouth. 2000 IU     B Complex-C (SUPER B COMPLEX PO) Take by mouth.     cetirizine (ZYRTEC) 10 MG tablet Take 1 tablet (10 mg total) by mouth daily. 30 tablet 0   cholecalciferol (VITAMIN D) 1000 units tablet  Take 1,000 Units by mouth daily.     hydrochlorothiazide (HYDRODIURIL) 25 MG tablet Take 1 tablet (25 mg total) by mouth daily. 90 tablet 1   irbesartan (AVAPRO) 300 MG tablet TAKE 1 TABLET(300 MG) BY MOUTH DAILY 90 tablet 3   milk thistle 175 MG tablet Take 175 mg by mouth daily.     phenazopyridine (PYRIDIUM) 100 MG tablet Take 1 tablet (100 mg total) by mouth 3 (three) times daily as needed for pain. 6 tablet 0   UNABLE TO FIND Provitalize: Natural Probiotic and weightmanagement     Zinc 50 MG TABS Take by mouth.     Facility-Administered Medications Prior to Visit  Medication Dose Route Frequency Provider Last Rate Last Admin   0.9 %  sodium chloride infusion  500 mL Intravenous Once Mauri Pole, MD       Past Medical History:  Diagnosis Date   Annual physical exam 08/04/2021   Anxiety    Depression    Gallstones    GERD (gastroesophageal reflux disease)    Hyperlipidemia    Hypertension    Need for immunization against influenza 08/04/2021   Past Surgical History:  Procedure Laterality Date   CERVICAL SPINE SURGERY  2011   CESAREAN SECTION  2000, 2002, 2005   x 3   CHOLECYSTECTOMY  2006   COLONOSCOPY  07/07/2018   UPPER GI ENDOSCOPY  07/07/2018   Allergies  Allergen Reactions  Latex    Diflucan [Fluconazole] Rash      Objective:    Physical Exam Vitals and nursing note reviewed.  Constitutional:      Appearance: Normal appearance. She is ill-appearing (very tired appearance, droopy eyelids).  HENT:     Right Ear: Tympanic membrane and ear canal normal.     Left Ear: Tympanic membrane and ear canal normal.     Nose: Congestion present.     Mouth/Throat:     Mouth: Mucous membranes are moist.     Pharynx: No oropharyngeal exudate or posterior oropharyngeal erythema.  Cardiovascular:     Rate and Rhythm: Normal rate and regular rhythm.  Pulmonary:     Effort: Pulmonary effort is normal.     Breath sounds: Normal breath sounds.  Musculoskeletal:         General: Normal range of motion.  Skin:    General: Skin is warm and dry.  Neurological:     Mental Status: She is alert.  Psychiatric:        Mood and Affect: Mood normal.        Behavior: Behavior normal.   BP 108/75 (BP Location: Left Arm, Patient Position: Sitting, Cuff Size: Large)   Pulse 99   Temp (!) 97.3 F (36.3 C) (Temporal)   Ht '5\' 8"'$  (1.727 m)   Wt 190 lb (86.2 kg)   SpO2 97%   BMI 28.89 kg/m  Wt Readings from Last 3 Encounters:  05/31/22 190 lb (86.2 kg)  05/16/22 192 lb (87.1 kg)  01/01/22 192 lb 2 oz (87.1 kg)       Jeanie Sewer, NP

## 2022-06-13 ENCOUNTER — Encounter: Payer: Self-pay | Admitting: Family Medicine

## 2022-06-13 ENCOUNTER — Ambulatory Visit (INDEPENDENT_AMBULATORY_CARE_PROVIDER_SITE_OTHER): Payer: BC Managed Care – PPO | Admitting: Family Medicine

## 2022-06-13 VITALS — BP 122/85 | HR 84 | Temp 98.3°F | Ht 68.0 in | Wt 189.8 lb

## 2022-06-13 DIAGNOSIS — J069 Acute upper respiratory infection, unspecified: Secondary | ICD-10-CM | POA: Diagnosis not present

## 2022-06-13 DIAGNOSIS — R5383 Other fatigue: Secondary | ICD-10-CM | POA: Diagnosis not present

## 2022-06-13 MED ORDER — AZITHROMYCIN 250 MG PO TABS: ORAL_TABLET | ORAL | 0 refills | Status: AC

## 2022-06-13 MED ORDER — BENZONATATE 100 MG PO CAPS: 100.0000 mg | ORAL_CAPSULE | Freq: Three times a day (TID) | ORAL | 0 refills | Status: DC | PRN

## 2022-06-13 MED ORDER — GABAPENTIN 100 MG PO CAPS: 100.0000 mg | ORAL_CAPSULE | Freq: Three times a day (TID) | ORAL | 3 refills | Status: DC

## 2022-06-13 NOTE — Progress Notes (Signed)
Subjective:     Patient ID: Angela Everett, female    DOB: 13-May-1966, 56 y.o.   MRN: [id number]  Chief Complaint  Patient presents with   Transitions Of Care    HPI TOC  Dad had Marfans.  Pt aorta on borderline.  Dx Covid mid Sept.  Symptoms for 10days and better.  Then 10days later, lost taste again and diarrhea.   Came in and given steroid shot and helped.  Then past 5 days, sore throat, sinus pressure, cough(bad), and now laryngitis for 2 days.  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  Temps 99.    2.  Fatigue/apnea-In process of sleep study-poor sleep. Menopause.  Occ hot flash.  Brain keeps going and anxiety.  has neuropathy as well.    Health Maintenance Due  Topic Date Due   Zoster Vaccines- Shingrix (1 of 2) Never done   COVID-19 Vaccine (5 - Moderna risk series) 07/16/2021   INFLUENZA VACCINE  03/27/2022    Past Medical History:  Diagnosis Date   Annual physical exam 08/04/2021   Anxiety    Depression    Gallstones    GERD (gastroesophageal reflux disease)    Hyperlipidemia    Hypertension    Need for immunization against influenza 08/04/2021    Past Surgical History:  Procedure Laterality Date   CERVICAL SPINE SURGERY  2011   CESAREAN SECTION  2000, 2002, 2005   x 3   CHOLECYSTECTOMY  2006   COLONOSCOPY  07/07/2018   UPPER GI ENDOSCOPY  07/07/2018    Outpatient Medications Prior to Visit  Medication Sig Dispense Refill   Ascorbic Acid (VITAMIN C PO) Take by mouth. 2000 IU     B Complex-C (SUPER B COMPLEX PO) Take by mouth.     cetirizine (ZYRTEC) 10 MG tablet Take 1 tablet (10 mg total) by mouth daily. 30 tablet 0   cholecalciferol (VITAMIN D) 1000 units tablet Take 1,000 Units by mouth daily.     hydrochlorothiazide (HYDRODIURIL) 25 MG tablet Take 1 tablet (25 mg total) by mouth daily. 90 tablet 1   irbesartan (AVAPRO) 300 MG tablet TAKE 1 TABLET(300 MG) BY MOUTH DAILY 90 tablet 3   milk thistle 175 MG tablet Take 175 mg by mouth daily.      phenazopyridine (PYRIDIUM) 100 MG tablet Take 1 tablet (100 mg total) by mouth 3 (three) times daily as needed for pain. 6 tablet 0   UNABLE TO FIND Provitalize: Natural Probiotic and weightmanagement     Zinc 50 MG TABS Take by mouth.     No facility-administered medications prior to visit.    Allergies  Allergen Reactions   Latex    Diflucan [Fluconazole] Rash   ROS neg/noncontributory except as noted HPI/below      Objective:     BP 122/85 (BP Location: Left Arm, Patient Position: Sitting)   Pulse 84   Temp 98.3 F (36.8 C) (Temporal)   Ht '5\' 8"'$  (1.727 m)   Wt 189 lb 12.8 oz (86.1 kg)   LMP 06/13/2018   SpO2 99%   BMI 28.86 kg/m  Wt Readings from Last 3 Encounters:  06/13/22 189 lb 12.8 oz (86.1 kg)  05/31/22 190 lb (86.2 kg)  05/16/22 192 lb (87.1 kg)    Physical Exam   Gen: WDWN NAD HEENT: NCAT, conjunctiva not injected, sclera nonicteric TM WNL B, OP moist, no exudates   very congested.  Sinuses sl tender to percussion.  Voice hoarse.  NECK:  supple, no thyromegaly, no  nodes, no carotid bruits CARDIAC: RRR, S1S2+, no murmur. DP 2+B LUNGS: CTAB. No wheezes ABDOMEN:  BS+, soft, NTND, No HSM, no masses EXT:  no edema MSK: no gross abnormalities.  NEURO: A&O x3.  CN II-XII intact.  PSYCH: normal mood. Good eye contact     Assessment & Plan:   Problem List Items Addressed This Visit   None Visit Diagnoses     Viral upper respiratory tract infection    -  Primary   Relevant Medications   azithromycin (ZITHROMAX) 250 MG tablet   Other fatigue         1.  Upper respiratory infection-probably viral.  However, I have some concerns as it is worsening and she is still recovering from [place], and recently received steroids.  Continue to monitor over the next couple of days.  Tessalon Perles 100 mg 3 times daily as needed sent in.  Z-Pak to hold-take if symptoms continue to worsen.  Discussed hand sanitizer etc. as she is around children all the time. 2.  Chronic  fatigue-partly due to poor sleep habits.  Referral was sent for sleep study as she has some witnessed apneas.  Also, compounded by neuropathy, menopause, anxiety.  Will trial gabapentin 100 mg to 300 mg nightly and see if this aids some.  Follow-up for annual physical in December.  Meds ordered this encounter  Medications   gabapentin (NEURONTIN) 100 MG capsule    Sig: Take 1 capsule (100 mg total) by mouth 3 (three) times daily.    Dispense:  90 capsule    Refill:  3   azithromycin (ZITHROMAX) 250 MG tablet    Sig: Take 2 tablets on day 1, then 1 tablet daily on days 2 through 5    Dispense:  6 tablet    Refill:  0   benzonatate (TESSALON PERLES) 100 MG capsule    Sig: Take 1 capsule (100 mg total) by mouth 3 (three) times daily as needed.    Dispense:  20 capsule    Refill:  0    Wellington Hampshire, MD

## 2022-06-13 NOTE — Patient Instructions (Addendum)
Try elderberry  For colds-[place].   Gabapentin '100mg'$  at bed, an increase to '300mg'$  if need.   Start checking blood pressure.

## 2022-07-03 ENCOUNTER — Ambulatory Visit: Payer: BC Managed Care – PPO | Admitting: Family

## 2022-07-03 ENCOUNTER — Encounter: Payer: Self-pay | Admitting: Family

## 2022-07-03 VITALS — BP 122/85 | HR 82 | Temp 97.3°F | Ht 68.0 in | Wt 190.1 lb

## 2022-07-03 DIAGNOSIS — R053 Chronic cough: Secondary | ICD-10-CM

## 2022-07-03 MED ORDER — BENZONATATE 200 MG PO CAPS: 200.0000 mg | ORAL_CAPSULE | Freq: Three times a day (TID) | ORAL | 0 refills | Status: AC | PRN

## 2022-07-03 MED ORDER — PREDNISONE 10 MG PO TABS: ORAL_TABLET | ORAL | 0 refills | Status: DC

## 2022-07-03 NOTE — Progress Notes (Signed)
Patient ID: Angela Everett, female    DOB: 01/19/1966, 56 y.o.   MRN: [id number]  Chief Complaint  Patient presents with   Cough    over 2 weeks   HPI:      Persistent cough:  Pt c/o Cough with yellow/green mucus, congestion and hoarseness for a couple of weeks. Denies fever, c/o mild sinus sx. Had covid in Sept, another URI last month & given Zpack. 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  Has tried mucinex liquid and tessalon pearles which has helped some.   Assessment & Plan:  1. Persistent cough sending pred pack, and refilling Tessalon Pearles, advised on use & SE, continue to drink plenty of liquids, continue OTC zinc, Vitamin C,. elderberry. Advised to get flu shot after pred pack is finished for better effectiveness.  - predniSONE (DELTASONE) 10 MG tablet; 6 tab day 1, 5 tab day 2-3, 4 tab day 4, 3 tab day 5  Dispense: 23 tablet; Refill: 0 - benzonatate (TESSALON) 200 MG capsule; Take 1 capsule (200 mg total) by mouth 3 (three) times daily as needed for up to 10 days for cough.  Dispense: 30 capsule; Refill: 0   Subjective:    Outpatient Medications Prior to Visit  Medication Sig Dispense Refill   Ascorbic Acid (VITAMIN C PO) Take by mouth. 2000 IU     B Complex-C (SUPER B COMPLEX PO) Take by mouth.     cetirizine (ZYRTEC) 10 MG tablet Take 1 tablet (10 mg total) by mouth daily. 30 tablet 0   cholecalciferol (VITAMIN D) 1000 units tablet Take 1,000 Units by mouth daily.     gabapentin (NEURONTIN) 100 MG capsule Take 1 capsule (100 mg total) by mouth 3 (three) times daily. 90 capsule 3   hydrochlorothiazide (HYDRODIURIL) 25 MG tablet Take 1 tablet (25 mg total) by mouth daily. 90 tablet 1   irbesartan (AVAPRO) 300 MG tablet TAKE 1 TABLET(300 MG) BY MOUTH DAILY 90 tablet 3   milk thistle 175 MG tablet Take 175 mg by mouth daily.     phenazopyridine (PYRIDIUM) 100 MG tablet Take 1 tablet (100 mg total) by mouth 3 (three) times daily as needed for  pain. 6 tablet 0   UNABLE TO FIND Provitalize: Natural Probiotic and weightmanagement     Zinc 50 MG TABS Take by mouth.     benzonatate (TESSALON PERLES) 100 MG capsule Take 1 capsule (100 mg total) by mouth 3 (three) times daily as needed. 20 capsule 0   No facility-administered medications prior to visit.   Past Medical History:  Diagnosis Date   Annual physical exam 08/04/2021   Anxiety    Depression    Gallstones    GERD (gastroesophageal reflux disease)    Hyperlipidemia    Hypertension    Need for immunization against influenza 08/04/2021   Past Surgical History:  Procedure Laterality Date   CERVICAL SPINE SURGERY  2011   CESAREAN SECTION  2000, 2002, 2005   x 3   CHOLECYSTECTOMY  2006   COLONOSCOPY  07/07/2018   UPPER GI ENDOSCOPY  07/07/2018   Allergies  Allergen Reactions   Latex    Diflucan [Fluconazole] Rash      Objective:    Physical Exam Vitals and nursing note reviewed.  Constitutional:      Appearance: Normal appearance.  Cardiovascular:     Rate and Rhythm: Normal rate and regular rhythm.  Pulmonary:     Effort: Pulmonary effort is normal.  Breath sounds: Normal breath sounds.  Musculoskeletal:        General: Normal range of motion.  Skin:    General: Skin is warm and dry.  Neurological:     Mental Status: She is alert.  Psychiatric:        Mood and Affect: Mood normal.        Behavior: Behavior normal.    BP 122/85 (BP Location: Left Arm, Patient Position: Sitting, Cuff Size: Large)   Pulse 82   Temp (!) 97.3 F (36.3 C) (Temporal)   Ht '5\' 8"'$  (1.727 m)   Wt 190 lb 2 oz (86.2 kg)   LMP 06/13/2018   SpO2 97%   BMI 28.91 kg/m  Wt Readings from Last 3 Encounters:  07/03/22 190 lb 2 oz (86.2 kg)  06/13/22 189 lb 12.8 oz (86.1 kg)  05/31/22 190 lb (86.2 kg)       Jeanie Sewer, NP

## 2022-08-06 ENCOUNTER — Other Ambulatory Visit: Payer: Self-pay | Admitting: Family

## 2022-08-09 ENCOUNTER — Encounter: Payer: Self-pay | Admitting: *Deleted

## 2022-08-14 ENCOUNTER — Other Ambulatory Visit: Payer: Self-pay | Admitting: *Deleted

## 2022-08-14 ENCOUNTER — Ambulatory Visit (INDEPENDENT_AMBULATORY_CARE_PROVIDER_SITE_OTHER): Payer: BC Managed Care – PPO | Admitting: Family Medicine

## 2022-08-14 ENCOUNTER — Encounter: Payer: Self-pay | Admitting: Family Medicine

## 2022-08-14 VITALS — BP 120/70 | HR 73 | Temp 98.1°F | Ht 68.0 in | Wt 199.1 lb

## 2022-08-14 DIAGNOSIS — Z23 Encounter for immunization: Secondary | ICD-10-CM | POA: Diagnosis not present

## 2022-08-14 DIAGNOSIS — Z0000 Encounter for general adult medical examination without abnormal findings: Secondary | ICD-10-CM

## 2022-08-14 DIAGNOSIS — I10 Essential (primary) hypertension: Secondary | ICD-10-CM | POA: Diagnosis not present

## 2022-08-14 MED ORDER — HYDROCHLOROTHIAZIDE 25 MG PO TABS: 25.0000 mg | ORAL_TABLET | Freq: Every day | ORAL | 1 refills | Status: DC

## 2022-08-14 MED ORDER — IRBESARTAN 300 MG PO TABS: ORAL_TABLET | ORAL | 3 refills | Status: DC

## 2022-08-14 NOTE — Patient Instructions (Addendum)
It was very nice to see you today!  [phone] for mammogram, other studies    PLEASE NOTE:  If you had any lab tests please let us know if you have not heard back within a few days. You may see your results on MyChart before we have a chance to review them but we will give you a call once they are reviewed by [place]. If we ordered any referrals today, please let us know if you have not heard from their office within the next week.   Please try these tips to maintain a healthy lifestyle:  Eat most of your calories during the day when you are active. Eliminate processed foods including packaged sweets (pies, cakes, cookies), reduce intake of potatoes, white bread, white pasta, and white rice. Look for whole grain options, oat flour or almond flour.  Each meal should contain half fruits/vegetables, one quarter protein, and one quarter carbs (no bigger than a computer mouse).  Cut down on sweet beverages. This includes juice, soda, and sweet tea. Also watch fruit intake, though this is a healthier sweet option, it still contains natural sugar! Limit to 3 servings daily.  Drink at least 1 glass of water with each meal and aim for at least 8 glasses per day  Exercise at least 150 minutes every week.

## 2022-08-14 NOTE — Progress Notes (Signed)
Phone [phone]   Subjective:   Patient is a 56 y.o. female presenting for annual physical.    Chief Complaint  Patient presents with   Annual Exam    CPE Not fasting    Annual-not exercising, not taking care of self.  A lot of stress w/job, etc.  No SI. Ate at 1130 HTN-tried to refill hctz and not able to refill.  So only taking 1/2 tabs to make it last.  Not checking.    See problem oriented charting- ROS- ROS: Gen: no fever, chills  Skin: no rash, itching. Saw Derm in spring.  ENT: freq URI-teaches.  Still hoarseness intermitt.  This past round of steroids-caused depression but better.  Eyes: no blurry vision, double vision Resp: no wheeze,SOB.  Still coughing since covid Sept 15. Intermitt.  Dry.  CV: no CP, palpitations, occ LE edema,  GI: no heartburn, n/v/d/c, abd pain GU: no dysuria, urgency, frequency, hematuria MSK: some joint pain/muscle pains Neuro: no dizziness, headache, weakness, vertigo.  Some neuropathy-gabapentin occ, occ sinus ha.   Psych: no SI   The following were reviewed and entered/updated in epic: Past Medical History:  Diagnosis Date   Annual physical exam 08/04/2021   Anxiety    Depression    Gallstones    GERD (gastroesophageal reflux disease)    Hyperlipidemia    Hypertension    Need for immunization against influenza 08/04/2021   Patient Active Problem List   Diagnosis Date Noted   Skin lesion 08/04/2021   Irritable bowel syndrome with diarrhea 08/04/2021   Anxiety and depression 04/22/2019   Primary osteoarthritis of both hands 07/08/2018   Primary osteoarthritis of both knees 07/08/2018   Primary osteoarthritis of both feet 07/08/2018   Myofascial pain 07/08/2018   Polyarthralgia 04/11/2018   Positive ANA (antinuclear antibody) 04/11/2018   Vitamin D deficiency, unspecified 04/09/2018   Hypertension, essential, benign 08/09/2017   Hyperlipidemia 08/09/2017   Past Surgical History:  Procedure Laterality Date   CERVICAL SPINE  SURGERY  2011   CESAREAN SECTION  2000, 2002, 2005   x 3   CHOLECYSTECTOMY  2006   COLONOSCOPY  07/07/2018   UPPER GI ENDOSCOPY  07/07/2018    Family History  Problem Relation Age of Onset   Depression Mother    Heart disease Mother    Hypertension Mother    Birth defects Father    Cancer Father        mets   Heart disease Father    Marfan syndrome Father    ADD / ADHD Daughter    Alcohol abuse Daughter    Depression Daughter    Drug abuse Daughter    Learning disabilities Daughter    Allergy (severe) Daughter    Depression Son    Allergy (severe) Son        Eosinophilic esophagitis   Heart disease Maternal Grandmother    Heart attack Maternal Grandmother    Cancer Paternal Grandmother        brain tumor   Early death Paternal Grandmother    Alcohol abuse Paternal Grandfather    Depression Paternal Grandfather    Bladder Cancer Paternal Grandfather    Depression Daughter    Hearing loss Daughter    Celiac disease Daughter     Medications- reviewed and updated Current Outpatient Medications  Medication Sig Dispense Refill   Ascorbic Acid (VITAMIN C PO) Take by mouth. 2000 IU     B Complex-C (SUPER B COMPLEX PO) Take by mouth.  cetirizine (ZYRTEC) 10 MG tablet Take 1 tablet (10 mg total) by mouth daily. 30 tablet 0   cholecalciferol (VITAMIN D) 1000 units tablet Take 1,000 Units by mouth daily.     gabapentin (NEURONTIN) 100 MG capsule Take 1 capsule (100 mg total) by mouth 3 (three) times daily. 90 capsule 3   milk thistle 175 MG tablet Take 175 mg by mouth daily.     UNABLE TO FIND Provitalize: Natural Probiotic and weightmanagement     Zinc 50 MG TABS Take by mouth.     hydrochlorothiazide (HYDRODIURIL) 25 MG tablet Take 1 tablet (25 mg total) by mouth daily. 90 tablet 1   hydrochlorothiazide (HYDRODIURIL) 25 MG tablet Take 1 tablet (25 mg total) by mouth daily. 90 tablet 1   irbesartan (AVAPRO) 300 MG tablet TAKE 1 TABLET(300 MG) BY MOUTH DAILY 90 tablet 3    No current facility-administered medications for this visit.    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   Objective  Objective:  BP 120/70   Pulse 73   Temp 98.1 F (36.7 C) (Temporal)   Ht '5\' 8"'$  (1.727 m)   Wt 199 lb 2 oz (90.3 kg)   LMP 06/13/2018   SpO2 99%   BMI 30.28 kg/m  Physical Exam  Gen: WDWN NAD HEENT: NCAT, conjunctiva not injected, sclera nonicteric TM WNL B, OP moist, no exudates  NECK:  supple, no thyromegaly, no nodes, no carotid bruits CARDIAC: RRR, S1S2+, no murmur. DP 2+B LUNGS: CTAB. No wheezes ABDOMEN:  BS+, soft, NTND, No HSM, no masses EXT:  no edema MSK: no gross abnormalities. MS 5/5 all 4 NEURO: A&O x3.  CN II-XII intact.  PSYCH: normal mood. Good eye contact    Assessment and Plan   Health Maintenance counseling: 1. Anticipatory guidance: Patient counseled regarding regular dental exams q6 months, eye exams,  avoiding smoking and second hand smoke, limiting alcohol to 1 beverage per day, no illicit drugs.   2. Risk factor reduction:  Advised patient of need for regular exercise and diet rich and fruits and vegetables to reduce risk of heart attack and stroke. Exercise- discussed.  Wt Readings from Last 3 Encounters:  08/14/22 199 lb 2 oz (90.3 kg)  07/03/22 190 lb 2 oz (86.2 kg)  06/13/22 189 lb 12.8 oz (86.1 kg)   3. Immunizations/screenings/ancillary studies Immunization History  Administered Date(s) Administered   Influenza,inj,Quad PF,6+ Mos 08/09/2017, 04/22/2019, 06/01/2020, 08/04/2021   Influenza-Unspecified 05/27/2018   Moderna Sars-Covid-2 Vaccination 10/27/2019, 11/25/2019, 07/03/2020, 12/31/2020   PFIZER SARS-COV-2 Pediatric Vaccination 5-72yr 05/21/2021   Health Maintenance Due  Topic Date Due   DTaP/Tdap/Td (1 - Tdap) Never done   INFLUENZA VACCINE  03/27/2022    4. Cervical cancer  screening- utd 5. Breast cancer screening-  mammogram utd 6. Colon cancer screening - utd 7. Skin cancer screening- advised regular sunscreen use. Denies worrisome, changing, or new skin lesions.  8. Birth control/STD check- n/a 9. Osteoporosis screening- n/a 10. Smoking associated screening - non smoker  Problem List Items Addressed This Visit       Cardiovascular and Mediastinum   Hypertension, essential, benign   Relevant Medications   hydrochlorothiazide (HYDRODIURIL) 25 MG tablet   irbesartan (AVAPRO) 300 MG tablet   Other Visit Diagnoses     Wellness examination    -  Primary   Relevant  Orders   Comprehensive metabolic panel   Hemoglobin A1c   Lipid panel   TSH   CBC with Differential/Platelet   Need for Tdap vaccination       Relevant Orders   Tdap vaccine greater than or equal to 7yo IM       Recommended follow up: [address] in about 6 months (around 02/13/2023) for htn. No future appointments.   Lab/Order associations:not fasting   ICD-10-CM   1. Wellness examination  Z00.00 Comprehensive metabolic panel    Hemoglobin A1c    Lipid panel    TSH    CBC with Differential/Platelet    2. Hypertension, essential, benign  I10 irbesartan (AVAPRO) 300 MG tablet    3. Need for Tdap vaccination  Z23 Tdap vaccine greater than or equal to 7yo IM     Wellness-anticipatory guidance.  Work on [employer]  Check CBC,CMP,lipids,TSH, A1C.  F/u 1 yr  HTN-chronic.  Well controlled.  Renewed hctz '25mg'$  and avapro '300mg'$   Meds ordered this encounter  Medications   hydrochlorothiazide (HYDRODIURIL) 25 MG tablet    Sig: Take 1 tablet (25 mg total) by mouth daily.    Dispense:  90 tablet    Refill:  1   irbesartan (AVAPRO) 300 MG tablet    Sig: TAKE 1 TABLET(300 MG) BY MOUTH DAILY    Dispense:  90 tablet    Refill:  3    AWellington Hampshire MD

## 2022-08-15 LAB — COMPREHENSIVE METABOLIC PANEL
ALT: 14 U/L (ref 0–35)
AST: 16 U/L (ref 0–37)
Albumin: 4.3 g/dL (ref 3.5–5.2)
Alkaline Phosphatase: 60 U/L (ref 39–117)
BUN: 11 mg/dL (ref 6–23)
CO2: 29 mEq/L (ref 19–32)
Calcium: 9.9 mg/dL (ref 8.4–10.5)
Chloride: 103 mEq/L (ref 96–112)
Creatinine, Ser: 0.8 mg/dL (ref 0.40–1.20)
GFR: 82.71 mL/min (ref >60.00)
Glucose, Bld: 83 mg/dL (ref 70–99)
Potassium: 4 mEq/L (ref 3.5–5.1)
Sodium: 141 mEq/L (ref 135–145)
Total Bilirubin: 0.4 mg/dL (ref 0.2–1.2)
Total Protein: 6.8 g/dL (ref 6.0–8.3)

## 2022-08-15 LAB — CBC WITH DIFFERENTIAL/PLATELET
Basophils Absolute: 0.1 10*3/uL (ref 0.0–0.1)
Basophils Relative: 0.9 % (ref 0.0–3.0)
Eosinophils Absolute: 0 10*3/uL (ref 0.0–0.7)
Eosinophils Relative: 0.7 % (ref 0.0–5.0)
HCT: 38.8 % (ref 36.0–46.0)
Hemoglobin: 13.1 g/dL (ref 12.0–15.0)
Lymphocytes Relative: 34.9 % (ref 12.0–46.0)
Lymphs Abs: 2.1 10*3/uL (ref 0.7–4.0)
MCHC: 33.7 g/dL (ref 30.0–36.0)
MCV: 92.5 fl (ref 78.0–100.0)
Monocytes Absolute: 0.4 10*3/uL (ref 0.1–1.0)
Monocytes Relative: 6 % (ref 3.0–12.0)
Neutro Abs: 3.5 10*3/uL (ref 1.4–7.7)
Neutrophils Relative %: 57.5 % (ref 43.0–77.0)
Platelets: 317 10*3/uL (ref 150.0–400.0)
RBC: 4.2 Mil/uL (ref 3.87–5.11)
RDW: 14.2 % (ref 11.5–15.5)
WBC: 6.1 10*3/uL (ref 4.0–10.5)

## 2022-08-15 LAB — LIPID PANEL
Cholesterol: 225 mg/dL — ABNORMAL HIGH (ref 0–200)
HDL: 81 mg/dL (ref >39.00)
LDL Cholesterol: 125 mg/dL — ABNORMAL HIGH (ref 0–99)
NonHDL: 143.68
Total CHOL/HDL Ratio: 3
Triglycerides: 93 mg/dL (ref 0.0–149.0)
VLDL: 18.6 mg/dL (ref 0.0–40.0)

## 2022-08-15 LAB — TSH: TSH: 0.98 u[IU]/mL (ref 0.35–5.50)

## 2022-08-15 LAB — HEMOGLOBIN A1C: Hgb A1c MFr Bld: 5.6 % (ref 4.6–6.5)

## 2022-08-16 NOTE — Progress Notes (Signed)
Labs ok except: 1.  Sugar at top end of normal-work on diet/exercise 2.  Cholesterol a little elevated but acceptable(wasn't totally fasting but fine).  Work on diet/exercise

## 2022-10-15 ENCOUNTER — Ambulatory Visit: Payer: BC Managed Care – PPO | Admitting: Family Medicine

## 2022-10-15 ENCOUNTER — Encounter: Payer: Self-pay | Admitting: Family Medicine

## 2022-10-15 VITALS — BP 140/90 | HR 75 | Temp 98.2°F | Ht 68.0 in | Wt 192.5 lb

## 2022-10-15 DIAGNOSIS — F411 Generalized anxiety disorder: Secondary | ICD-10-CM

## 2022-10-15 MED ORDER — ESCITALOPRAM OXALATE 10 MG PO TABS: 10.0000 mg | ORAL_TABLET | Freq: Every day | ORAL | 1 refills | Status: DC

## 2022-10-15 MED ORDER — HYDROXYZINE PAMOATE 25 MG PO CAPS: 25.0000 mg | ORAL_CAPSULE | Freq: Three times a day (TID) | ORAL | 0 refills | Status: DC | PRN

## 2022-10-15 NOTE — Progress Notes (Signed)
Subjective:     Patient ID: Angela Everett, female    DOB: 03-03-1966, 57 y.o.   MRN: [id number]  Chief Complaint  Patient presents with   Depression   Anxiety   Stress    Stress hives started a few months ago, hair falling out    HPI Stress/anxiety-work unmanageable.  Just can't go to work some days.  Gets thru the week at work but Sunday, anx starts.  Hair falling out, hives(get when stressed).  Empty nester, boyfriend moved.  Going to [employer]. Feels shameful. No SI. Lexapro in past worked well.  Prozac not good in past.  Counseling in past.  Some PTSD as well.  FH addiction.  Hives for few months-stress.  Can't run d/t injuries and that would work.   There are no preventive care reminders to display for this patient.  Past Medical History:  Diagnosis Date   Annual physical exam 08/04/2021   Anxiety    Depression    Gallstones    GERD (gastroesophageal reflux disease)    Hyperlipidemia    Hypertension    Need for immunization against influenza 08/04/2021    Past Surgical History:  Procedure Laterality Date   CERVICAL SPINE SURGERY  2011   CESAREAN SECTION  2000, 2002, 2005   x 3   CHOLECYSTECTOMY  2006   COLONOSCOPY  07/07/2018   UPPER GI ENDOSCOPY  07/07/2018    Outpatient Medications Prior to Visit  Medication Sig Dispense Refill   Ascorbic Acid (VITAMIN C PO) Take by mouth. 2000 IU     B Complex-C (SUPER B COMPLEX PO) Take by mouth.     cetirizine (ZYRTEC) 10 MG tablet Take 1 tablet (10 mg total) by mouth daily. 30 tablet 0   cholecalciferol (VITAMIN D) 1000 units tablet Take 1,000 Units by mouth daily.     gabapentin (NEURONTIN) 100 MG capsule Take 1 capsule (100 mg total) by mouth 3 (three) times daily. 90 capsule 3   hydrochlorothiazide (HYDRODIURIL) 25 MG tablet Take 1 tablet (25 mg total) by mouth daily. 90 tablet 1   irbesartan (AVAPRO) 300 MG tablet TAKE 1 TABLET(300 MG) BY MOUTH DAILY 90 tablet 3   milk thistle 175 MG tablet Take 175 mg by  mouth daily.     UNABLE TO FIND Provitalize: Natural Probiotic and weightmanagement     Zinc 50 MG TABS Take by mouth.     hydrochlorothiazide (HYDRODIURIL) 25 MG tablet Take 1 tablet (25 mg total) by mouth daily. 90 tablet 1   No facility-administered medications prior to visit.    Allergies  Allergen Reactions   Latex    Diflucan [Fluconazole] Rash   ROS neg/noncontributory except as noted HPI/below  Numbness hands/feet intermitt and "detached" at times.       Objective:     BP (!) 140/90   Pulse 75   Temp 98.2 F (36.8 C) (Temporal)   Ht 5' 8"$  (1.727 m)   Wt 192 lb 8 oz (87.3 kg)   LMP 06/13/2018   SpO2 98%   BMI 29.27 kg/m  Wt Readings from Last 3 Encounters:  10/15/22 192 lb 8 oz (87.3 kg)  08/14/22 199 lb 2 oz (90.3 kg)  07/03/22 190 lb 2 oz (86.2 kg)    Physical Exam   Gen: WDWN NAD HEENT: NCAT, conjunctiva not injected, sclera nonicteric CARDIAC: RRR, S1S2+, no murmur.  MSK: no gross abnormalities.  NEURO: A&O x3.  CN II-XII intact.  PSYCH: anxious mood. Good eye  contact.      Assessment & Plan:   Problem List Items Addressed This Visit   None Visit Diagnoses     GAD (generalized anxiety disorder)    -  Primary   Relevant Medications   escitalopram (LEXAPRO) 10 MG tablet   hydrOXYzine (VISTARIL) 25 MG capsule      GAD-recurrant.  Will start lexapro 72m daily as worked well in past.  Also, hydoxyzine 279mtid prn for anxiety,hives, sleep.  Pt on wait list for counseling.   F/u 1 mo.  Consider LOA.   Meds ordered this encounter  Medications   escitalopram (LEXAPRO) 10 MG tablet    Sig: Take 1 tablet (10 mg total) by mouth daily.    Dispense:  30 tablet    Refill:  1   hydrOXYzine (VISTARIL) 25 MG capsule    Sig: Take 1 capsule (25 mg total) by mouth every 8 (eight) hours as needed.    Dispense:  30 capsule    Refill:  0    AnWellington HampshireMD

## 2022-10-15 NOTE — Patient Instructions (Signed)
It was very nice to see you today!  Work on exercise     PLEASE NOTE:  If you had any lab tests please let us know if you have not heard back within a few days. You may see your results on MyChart before we have a chance to review them but we will give you a call once they are reviewed by [place]. If we ordered any referrals today, please let us know if you have not heard from their office within the next week.   Please try these tips to maintain a healthy lifestyle:  Eat most of your calories during the day when you are active. Eliminate processed foods including packaged sweets (pies, cakes, cookies), reduce intake of potatoes, white bread, white pasta, and white rice. Look for whole grain options, oat flour or almond flour.  Each meal should contain half fruits/vegetables, one quarter protein, and one quarter carbs (no bigger than a computer mouse).  Cut down on sweet beverages. This includes juice, soda, and sweet tea. Also watch fruit intake, though this is a healthier sweet option, it still contains natural sugar! Limit to 3 servings daily.  Drink at least 1 glass of water with each meal and aim for at least 8 glasses per day  Exercise at least 150 minutes every week.

## 2022-10-29 ENCOUNTER — Encounter: Payer: Self-pay | Admitting: Family Medicine

## 2022-10-31 ENCOUNTER — Other Ambulatory Visit: Payer: Self-pay | Admitting: *Deleted

## 2022-10-31 MED ORDER — ESCITALOPRAM OXALATE 20 MG PO TABS: 20.0000 mg | ORAL_TABLET | Freq: Every day | ORAL | 0 refills | Status: DC

## 2022-11-16 ENCOUNTER — Encounter: Payer: Self-pay | Admitting: Family Medicine

## 2022-11-16 ENCOUNTER — Telehealth (INDEPENDENT_AMBULATORY_CARE_PROVIDER_SITE_OTHER): Payer: BC Managed Care – PPO | Admitting: Family Medicine

## 2022-11-16 DIAGNOSIS — F419 Anxiety disorder, unspecified: Secondary | ICD-10-CM | POA: Diagnosis not present

## 2022-11-16 DIAGNOSIS — F32A Anxiety disorder, unspecified: Secondary | ICD-10-CM

## 2022-11-16 MED ORDER — ESCITALOPRAM OXALATE 20 MG PO TABS: 20.0000 mg | ORAL_TABLET | Freq: Every day | ORAL | 1 refills | Status: DC

## 2022-11-16 NOTE — Patient Instructions (Signed)
Glad you are doing better.     Let me know if we need to increase dose to 30mg .  I sent the 20mg .  Please schedule for 6 months  Enjoy the break!

## 2022-11-16 NOTE — Progress Notes (Signed)
MyChart Video Visit    Virtual Visit via Video Note   This visit type was conducted by video.This format is felt to be most appropriate for this patient at this time. Physical exam was limited by quality of the video and audio technology used for the visit. CMA was able to get the patient set up on a video visit.  Patient location: Home. Patient and provider in visit Provider location: Office  I discussed the limitations of evaluation and management by telemedicine and the availability of in person appointments. The patient expressed understanding and agreed to proceed.  Visit Date: 11/16/2022  Today's healthcare provider: Wellington Hampshire, MD     Subjective:    Patient ID: Angela Everett, female    DOB: 1966/03/04, 57 y.o.   MRN: [id number]  Chief Complaint  Patient presents with   Follow-up    4 week follow-up on moods     HPI  Anxiety-Lexapro on 20mg .  Was on 30mg  in the past.  It is helping but not optimal.  No SI.  Going to [facility].  Still on waiting list for counselling.  Nothing acute.  Going to work.  Walks dog.    Past Medical History:  Diagnosis Date   Annual physical exam 08/04/2021   Anxiety    Depression    Gallstones    GERD (gastroesophageal reflux disease)    Hyperlipidemia    Hypertension    Need for immunization against influenza 08/04/2021    Past Surgical History:  Procedure Laterality Date   CERVICAL SPINE SURGERY  2011   CESAREAN SECTION  2000, 2002, 2005   x 3   CHOLECYSTECTOMY  2006   COLONOSCOPY  07/07/2018   UPPER GI ENDOSCOPY  07/07/2018    Outpatient Medications Prior to Visit  Medication Sig Dispense Refill   Ascorbic Acid (VITAMIN C PO) Take by mouth. 2000 IU     B Complex-C (SUPER B COMPLEX PO) Take by mouth.     cetirizine (ZYRTEC) 10 MG tablet Take 1 tablet (10 mg total) by mouth daily. 30 tablet 0   cholecalciferol (VITAMIN D) 1000 units tablet Take 1,000 Units by mouth daily.     hydrochlorothiazide (HYDRODIURIL) 25  MG tablet Take 1 tablet (25 mg total) by mouth daily. 90 tablet 1   irbesartan (AVAPRO) 300 MG tablet TAKE 1 TABLET(300 MG) BY MOUTH DAILY 90 tablet 3   milk thistle 175 MG tablet Take 175 mg by mouth daily.     UNABLE TO FIND Provitalize: Natural Probiotic and weightmanagement     Zinc 50 MG TABS Take by mouth.     escitalopram (LEXAPRO) 20 MG tablet Take 1 tablet (20 mg total) by mouth daily. 30 tablet 0   gabapentin (NEURONTIN) 100 MG capsule Take 1 capsule (100 mg total) by mouth 3 (three) times daily. (Patient not taking: Reported on 11/16/2022) 90 capsule 3   hydrOXYzine (VISTARIL) 25 MG capsule Take 1 capsule (25 mg total) by mouth every 8 (eight) hours as needed. (Patient not taking: Reported on 11/16/2022) 30 capsule 0   No facility-administered medications prior to visit.    Allergies  Allergen Reactions   Latex    Diflucan [Fluconazole] Rash   Stye R eye for 2 wks-better, but still there.      Objective:     Physical Exam  Vitals and nursing note reviewed.  Constitutional:      General:  is not in acute distress.    Appearance: Normal appearance.  HENT:     Head: Normocephalic.  Pulmonary:     Effort: No respiratory distress.  Skin:    General: Skin is dry.     Coloration: Skin is not pale.  Neurological:     Mental Status: Pt is alert and oriented to person, place, and time.  Psychiatric:        Mood and Affect: Mood normal.   LMP 06/13/2018   Wt Readings from Last 3 Encounters:  10/15/22 192 lb 8 oz (87.3 kg)  08/14/22 199 lb 2 oz (90.3 kg)  07/03/22 190 lb 2 oz (86.2 kg)       Assessment & Plan:   Problem List Items Addressed This Visit       Other   Anxiety and depression - Primary   Relevant Medications   escitalopram (LEXAPRO) 20 MG tablet   Anxiety/depression-doing better on lexapro 20mg .  Has good support system.  Still on list for counseling.  Was on 30mg  in past - will message/call if feels need to increase.  F/u 6 mo  Meds ordered this  encounter  Medications   escitalopram (LEXAPRO) 20 MG tablet    Sig: Take 1 tablet (20 mg total) by mouth daily.    Dispense:  90 tablet    Refill:  1    I discussed the assessment and treatment plan with the patient. The patient was provided an opportunity to ask questions and all were answered. The patient agreed with the plan and demonstrated an understanding of the instructions.   The patient was advised to call back or seek an in-person evaluation if the symptoms worsen or if the condition fails to improve as anticipated.    Wellington Hampshire, MD [place] [phone] (phone) [phone] (fax)  [place]

## 2022-12-07 ENCOUNTER — Other Ambulatory Visit: Payer: Self-pay | Admitting: Family Medicine

## 2023-03-18 ENCOUNTER — Encounter: Payer: Self-pay | Admitting: Family Medicine

## 2023-03-22 ENCOUNTER — Other Ambulatory Visit: Payer: Self-pay | Admitting: Family Medicine

## 2023-07-08 ENCOUNTER — Encounter: Payer: Self-pay | Admitting: Family Medicine

## 2023-07-21 ENCOUNTER — Other Ambulatory Visit: Payer: Self-pay | Admitting: Family Medicine

## 2023-07-21 NOTE — Telephone Encounter (Signed)
She got in July for #90/1-also, due for appt

## 2023-07-22 NOTE — Telephone Encounter (Signed)
LVM informing pt of message below. Informed to callback for scheduling.

## 2023-07-29 ENCOUNTER — Encounter (HOSPITAL_COMMUNITY): Payer: Self-pay

## 2023-07-29 ENCOUNTER — Emergency Department (HOSPITAL_COMMUNITY): Payer: Worker's Compensation

## 2023-07-29 ENCOUNTER — Emergency Department (HOSPITAL_COMMUNITY)
Admission: EM | Admit: 2023-07-29 | Discharge: 2023-07-29 | Disposition: A | Discharge status: Home / Self Care | Payer: Worker's Compensation | Attending: Emergency Medicine | Admitting: Emergency Medicine

## 2023-07-29 DIAGNOSIS — I10 Essential (primary) hypertension: Secondary | ICD-10-CM | POA: Insufficient documentation

## 2023-07-29 DIAGNOSIS — Z91040 Latex allergy status: Secondary | ICD-10-CM | POA: Insufficient documentation

## 2023-07-29 DIAGNOSIS — R519 Headache, unspecified: Secondary | ICD-10-CM | POA: Diagnosis present

## 2023-07-29 DIAGNOSIS — F0781 Postconcussional syndrome: Secondary | ICD-10-CM | POA: Diagnosis not present

## 2023-07-29 MED ORDER — ACETAMINOPHEN 500 MG PO TABS: 500.0000 mg | ORAL_TABLET | Freq: Four times a day (QID) | ORAL | 0 refills | Status: AC | PRN

## 2023-07-29 MED ORDER — ONDANSETRON HCL 4 MG PO TABS: 4.0000 mg | ORAL_TABLET | Freq: Three times a day (TID) | ORAL | 0 refills | Status: DC | PRN

## 2023-07-29 NOTE — ED Triage Notes (Signed)
Pt arrived via POV, mechanical fall last week. Was seen at [facility] for head and neck pain. Had follow up today at [facility] and was sent to ED for continued headache, light sensitivity and blurred vision for a couple days.

## 2023-07-29 NOTE — Discharge Instructions (Addendum)
[phone] [facility] is the only comprehensive, holistic concussion clinic in the [place], [place] area. You can speak with one of our concussion-trained staff members during our regular office hours, Monday through Friday from 8 a.m. to 5 p.m., by calling our Concussion Hotline: [phone].

## 2023-07-29 NOTE — ED Provider Notes (Signed)
[place] EMERGENCY DEPARTMENT AT [facility] Provider Note   CSN: [id number] Arrival date & time: 07/29/23  1430     History  Chief Complaint  Patient presents with   Head Injury    Angela Everett is a 57 y.o. female.  The history is provided by the patient and medical records. No language interpreter was used.  Head Injury    57 year old female with significant history of hypertension, anxiety, depression, sent here from urgent care center with concerns of headache.  Patient report a week ago she was sitting down on a chair when it broke causing her to fall backwards striking her head against the ground.  She is unsure if she experienced any loss of consciousness but since then she has had progressive worsening pain to the left side of her head, some blurry vision, feeling nauseous and having light and sound sensitivity.  She tries taking Tylenol but noticed no significant improvement.  She was initially seen at the urgent care center when she first fell and was recommended for conservative management.  Given her worsening symptoms, urgent care center recommend patient to come to ER for further assessment and likely CT scan of her head.  She is not on any blood thinning medication.  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  Home Medications Prior to Admission medications   Medication Sig Start Date End Date Taking? Authorizing Provider  Ascorbic Acid (VITAMIN C PO) Take by mouth. 2000 IU    [provider]  B Complex-C (SUPER B COMPLEX PO) Take by mouth.    [provider]  cetirizine (ZYRTEC) 10 MG tablet Take 1 tablet (10 mg total) by mouth daily. 11/25/17   Deeann Saint, MD  cholecalciferol (VITAMIN D) 1000 units tablet Take 1,000 Units by mouth daily.    [provider]  escitalopram (LEXAPRO) 20 MG tablet TAKE 1 TABLET BY MOUTH EVERY DAY 03/24/23   Jarold Motto,  PA  gabapentin (NEURONTIN) 100 MG capsule Take 1 capsule (100 mg total) by mouth 3 (three) times daily. Patient not taking: Reported on 11/16/2022 06/13/22   Jeani Sow, MD  hydrochlorothiazide (HYDRODIURIL) 25 MG tablet Take 1 tablet (25 mg total) by mouth daily. 08/14/22   Jeani Sow, MD  irbesartan (AVAPRO) 300 MG tablet TAKE 1 TABLET(300 MG) BY MOUTH DAILY 08/14/22   Jeani Sow, MD  milk thistle 175 MG tablet Take 175 mg by mouth daily.    [provider]  UNABLE TO FIND Provitalize: Natural Probiotic and weightmanagement    [provider]  Zinc 50 MG TABS Take by mouth.    [provider]      Allergies    Latex and Diflucan [fluconazole]    Review of Systems   Review of Systems  All other systems reviewed and are negative.   Physical Exam Updated Vital Signs BP (!) 152/92 (BP Location: Left Arm)   Pulse 76   Temp 98.5 F (36.9 C) (Oral)   Resp 16   LMP 06/13/2018   SpO2 100%  Physical Exam Vitals and nursing note reviewed.  Constitutional:      General: She is not in acute distress.    Appearance: She is well-developed.  HENT:     Head: Normocephalic and atraumatic.     Comments: Mild tenderness noted to the occipital scalp and the vertex of scalp without any bruising or crepitus  hematoma or laceration. Eyes:     Conjunctiva/sclera: Conjunctivae normal.  Cardiovascular:     Rate and Rhythm: Normal rate and regular rhythm.     Pulses: Normal pulses.     Heart sounds: Normal heart sounds.  Pulmonary:     Effort: Pulmonary effort is normal.  Musculoskeletal:     Cervical back: Neck supple.  Skin:    Findings: No rash.  Neurological:     Mental Status: She is alert and oriented to person, place, and time.     GCS: GCS eye subscore is 4. GCS verbal subscore is 5. GCS motor subscore is 6.     Cranial Nerves: Cranial nerves 2-12 are intact.     Sensory: Sensation is intact.     Motor: Motor function is intact.      Coordination: Coordination is intact.  Psychiatric:        Mood and Affect: Mood normal.     ED Results / Procedures / Treatments   Labs (all labs ordered are listed, but only abnormal results are displayed) Labs Reviewed - No data to display  EKG None  Radiology CT Head Wo Contrast  Result Date: 07/29/2023 CLINICAL DATA:  Head trauma, altered mental status (Ped 0-17y) EXAM: CT HEAD WITHOUT CONTRAST TECHNIQUE: Contiguous axial images were obtained from the base of the skull through the vertex without intravenous contrast. RADIATION DOSE REDUCTION: This exam was performed according to the departmental dose-optimization program which includes automated exposure control, adjustment of the mA and/or kV according to patient size and/or use of iterative reconstruction technique. COMPARISON:  None Available. FINDINGS: Brain: No hemorrhage. Hydrocephalus. No extra-axial fluid collection. No CT evidence of an acute cortical infarct. No mass. No mass lesion. Vascular: No hyperdense vessel or unexpected calcification. Skull: Normal. Negative for fracture or focal lesion. Sinuses/Orbits: No middle ear or mastoid effusion. Paranasal sinuses are clear. Orbits are unremarkable. Other: None. IMPRESSION: No CT evidence of intracranial injury. Electronically Signed   By: Lorenza Cambridge M.D.   On: 07/29/2023 16:21    Procedures Procedures    Medications Ordered in ED Medications - No data to display  ED Course/ Medical Decision Making/ A&P                                 Medical Decision Making Amount and/or Complexity of Data Reviewed Radiology: ordered.  Risk OTC drugs. Prescription drug management.   BP (!) 152/92 (BP Location: Left Arm)   Pulse 76   Temp 98.5 F (36.9 C) (Oral)   Resp 16   LMP 06/13/2018   SpO2 100%   33:84 PM 56 year old female with significant history of hypertension, anxiety, depression, sent here from urgent care center with concerns of headache.  Patient report a  week ago she was sitting down on a chair when it broke causing her to fall backwards striking her head against the ground.  She is unsure if she experienced any loss of consciousness but since then she has had progressive worsening pain to the left side of her head, some blurry vision, feeling nauseous and having light and sound sensitivity.  She tries taking Tylenol but noticed no significant improvement.  She was initially seen at the urgent care center when she first fell and was recommended for conservative management.  Given her worsening symptoms, urgent care center recommend patient to come to ER for further assessment and likely CT scan of her head.  She is not on any blood thinning medication.  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  Exam remarkable for tenderness to the occipital scalp without any concerning signs of injury.  Patient is mentating appropriately no focal neurodeficit.  Her symptoms likely consistent with a concussion.  Head CT scan ordered to rule out occult fracture or head bleed.  4:49 PM CT scan obtained independent viewed interpreted by me without any concerning finding.  Agree with radiology interpretation.  I discussed findings with patient, symptoms are suggestive of a postconcussive syndrome.  Will provide work note, recommend adequate rest, will discharge home with Tylenol and Zofran for symptom control.  Hospital admission considered but patient otherwise stable for discharge and can follow-up with a postconcussive clinic which I will provide information.  Patient voiced understanding and agrees with plan.  She is mentating appropriately and ambulating without difficulty and is stable for discharge.  Social determinant health including depression.        Final Clinical Impression(s) / ED Diagnoses Final diagnoses:  Post concussion syndrome    Rx / DC Orders ED Discharge Orders          Ordered     ondansetron (ZOFRAN) 4 MG tablet  Every 8 hours PRN        07/29/23 1640    acetaminophen (TYLENOL) 500 MG tablet  Every 6 hours PRN        07/29/23 1640              Fayrene Helper, PA-C 07/29/23 1650    Jacalyn Lefevre, MD 07/30/23 [phone]

## 2023-08-16 IMAGING — MG MM DIGITAL SCREENING BILAT W/ TOMO AND CAD
6 of 10 series · 6 of 30 positions shown · non-contrast
Comparison: Previous exam(s).

CLINICAL DATA: Screening.

EXAM:
DIGITAL SCREENING BILATERAL MAMMOGRAM WITH TOMOSYNTHESIS AND CAD
TECHNIQUE: Bilateral screening digital craniocaudal and mediolateral oblique
mammograms were obtained. Bilateral screening digital breast
tomosynthesis was performed. The images were evaluated with
computer-aided detection.

[L CC synth-2D]
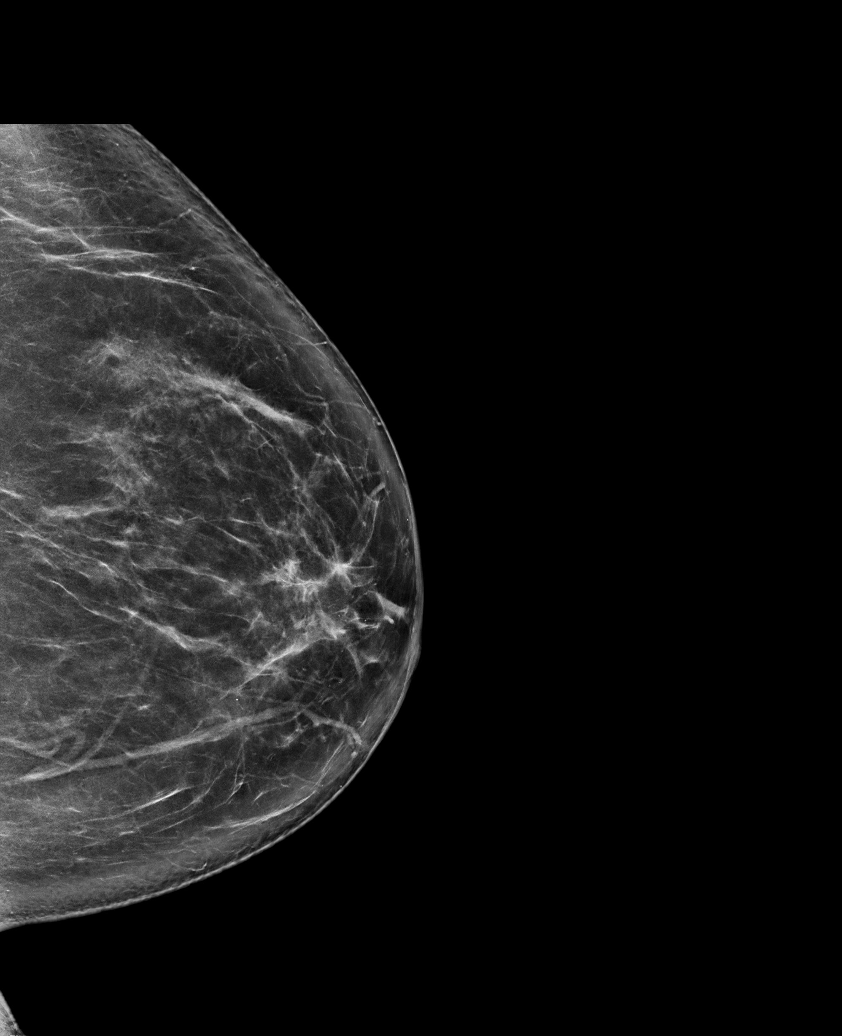

[L MLO synth-2D (1 of 2)]
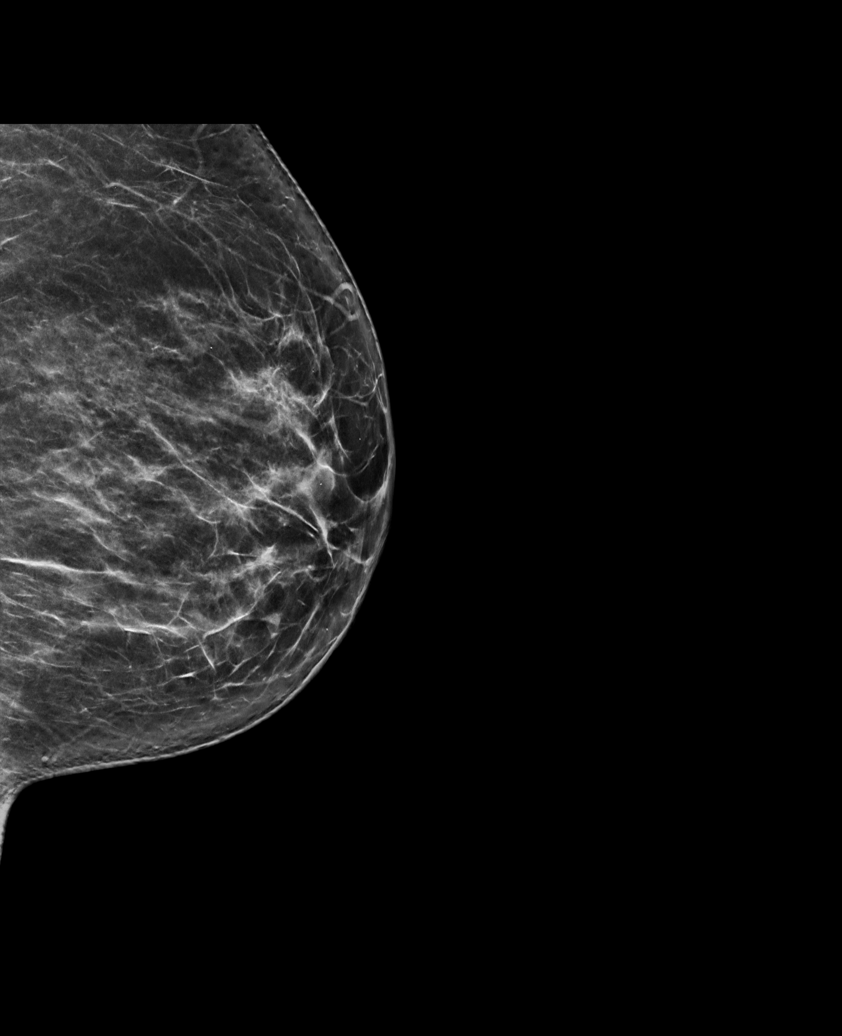

[R CC synth-2D]
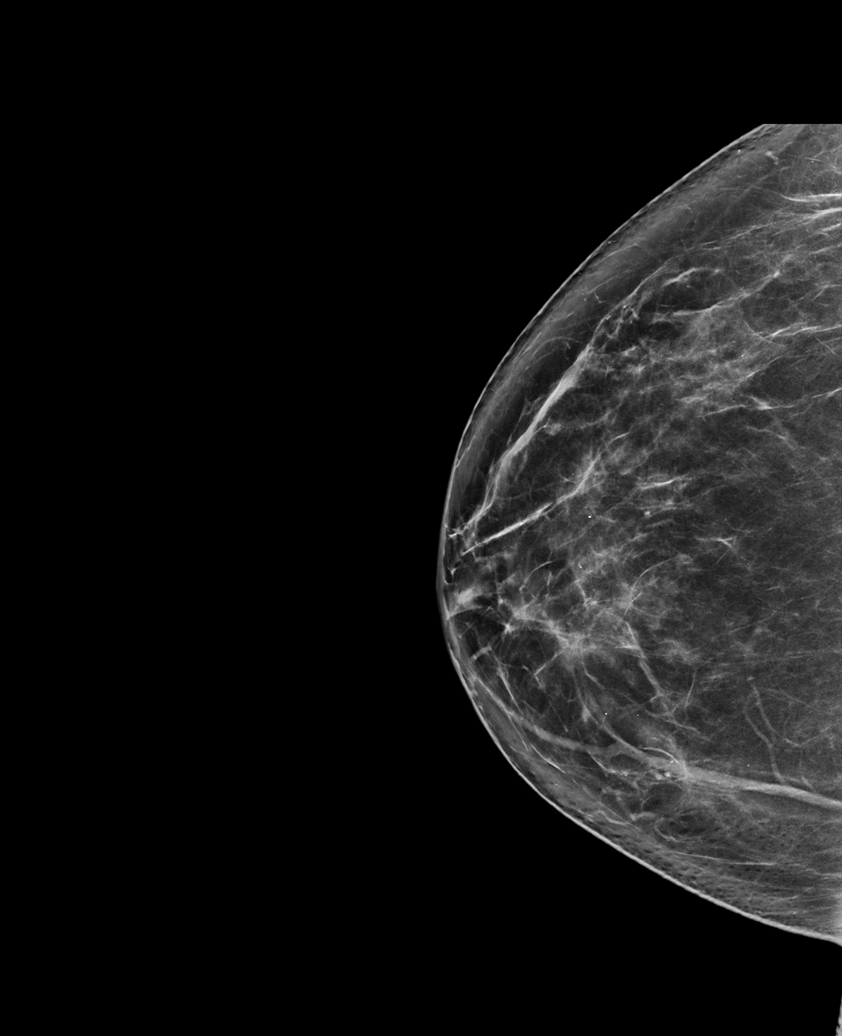

[L MLO synth-2D (2 of 2)]
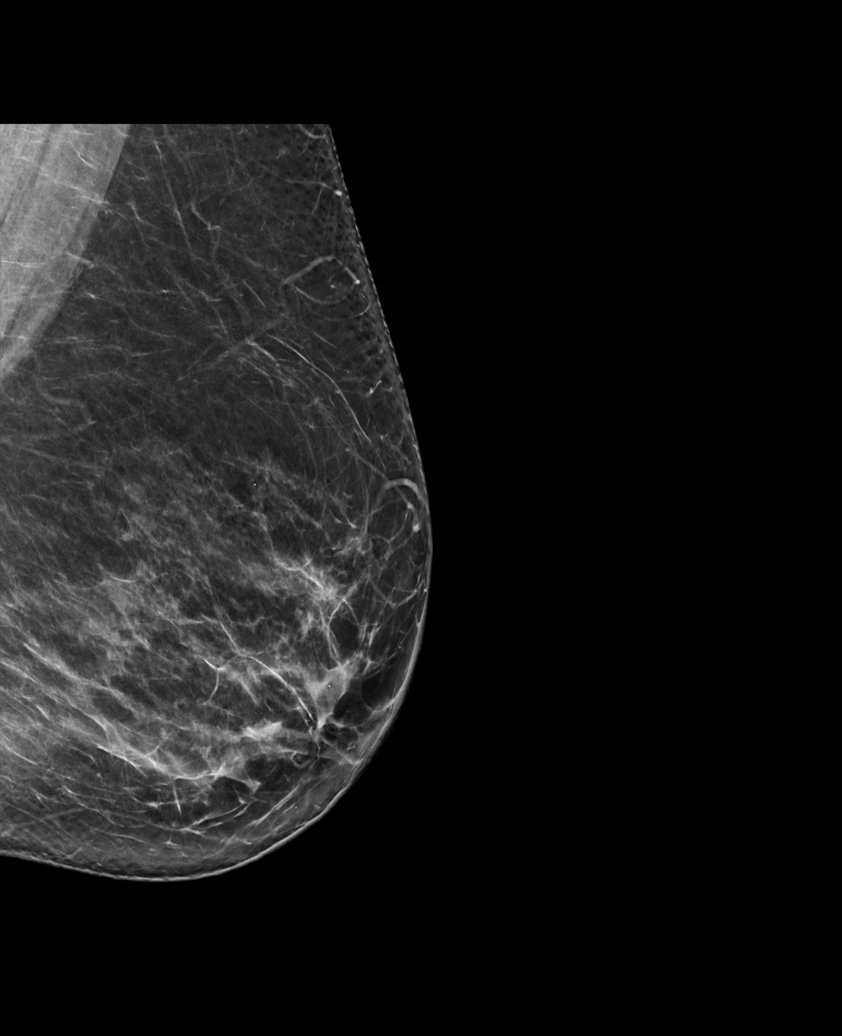

[R MLO synth-2D]
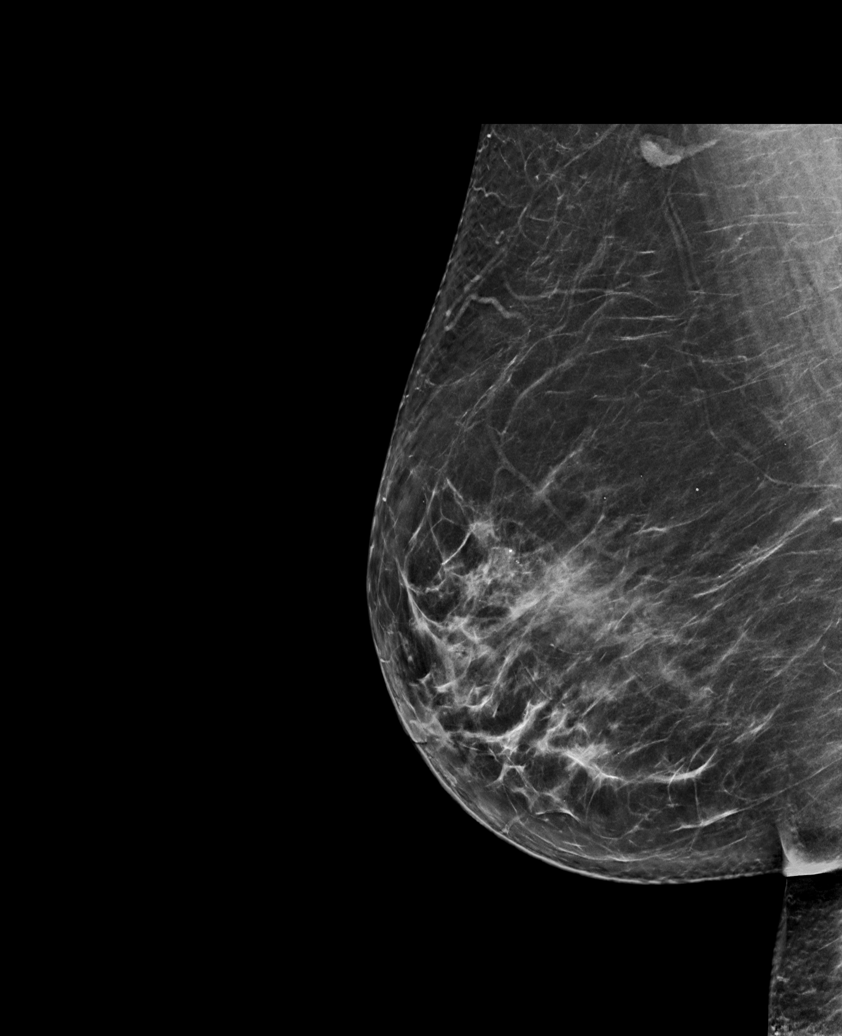

[L CC tomo · tomo slice 45/88.0]
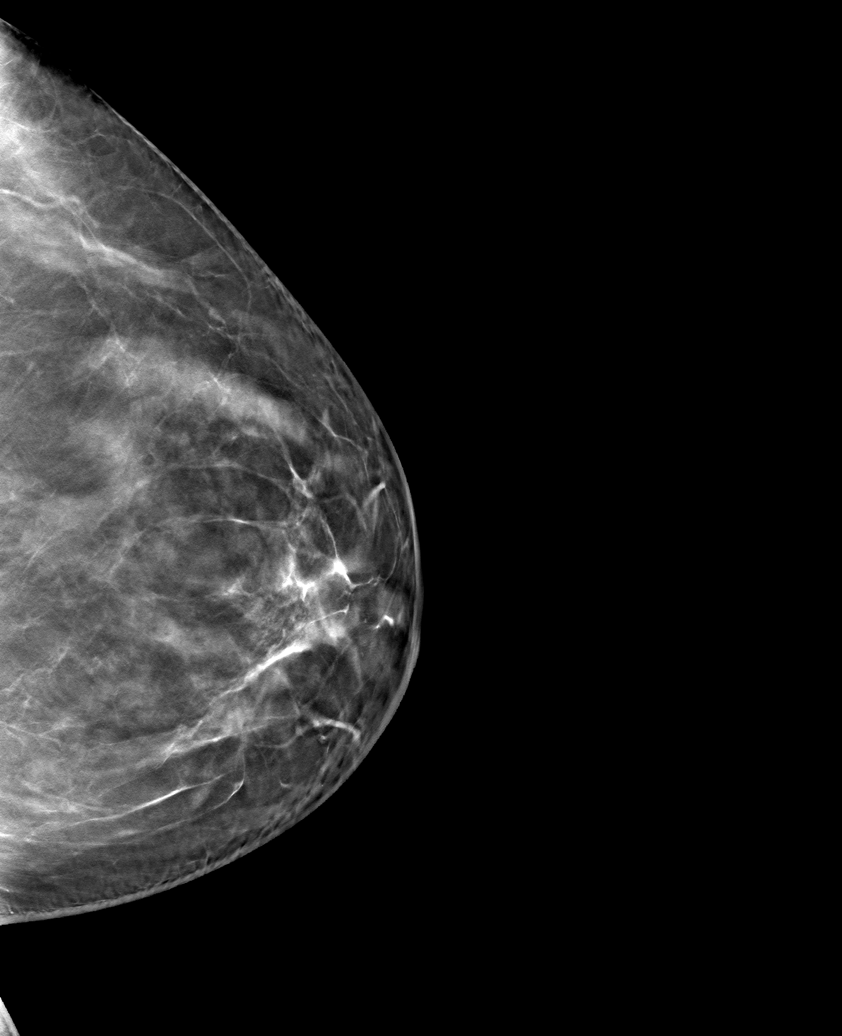

[6 of 30 positions shown; findings below may reference images not displayed]

ACR Breast Density Category b: There are scattered areas of
fibroglandular density.
FINDINGS: There are no findings suspicious for malignancy.
IMPRESSION: No mammographic evidence of malignancy. A result letter of this
screening mammogram will be mailed directly to the patient.

RECOMMENDATION:
Screening mammogram in one year. (Code:51-O-LD2)

BI-RADS CATEGORY  1: Negative.

## 2023-08-23 ENCOUNTER — Telehealth: Payer: Self-pay | Admitting: *Deleted

## 2023-08-23 NOTE — Telephone Encounter (Signed)
Please see message below and advise.    Copied from CRM [id number]. Topic: Clinical - Medication Question >> Aug 23, 2023  1:58 PM Samuel Jester B wrote: Reason for CRM: Pt would like to know if she can get a temporary prescription for escitalopram until she has her upcoming appointment on 08/27/23.

## 2023-08-25 ENCOUNTER — Other Ambulatory Visit: Payer: Self-pay | Admitting: Family Medicine

## 2023-08-25 MED ORDER — ESCITALOPRAM OXALATE 20 MG PO TABS: 20.0000 mg | ORAL_TABLET | Freq: Every day | ORAL | 0 refills | Status: DC

## 2023-08-27 ENCOUNTER — Ambulatory Visit: Payer: BC Managed Care – PPO | Admitting: Family Medicine

## 2023-08-27 ENCOUNTER — Encounter: Payer: Self-pay | Admitting: Family Medicine

## 2023-08-27 VITALS — BP 140/82 | HR 63 | Temp 98.0°F | Resp 18 | Ht 68.0 in | Wt 212.1 lb

## 2023-08-27 DIAGNOSIS — F419 Anxiety disorder, unspecified: Secondary | ICD-10-CM | POA: Diagnosis not present

## 2023-08-27 DIAGNOSIS — I10 Essential (primary) hypertension: Secondary | ICD-10-CM | POA: Diagnosis not present

## 2023-08-27 DIAGNOSIS — F32A Depression, unspecified: Secondary | ICD-10-CM

## 2023-08-27 MED ORDER — HYDROCHLOROTHIAZIDE 25 MG PO TABS: 25.0000 mg | ORAL_TABLET | Freq: Every day | ORAL | 1 refills | Status: DC

## 2023-08-27 MED ORDER — ESCITALOPRAM OXALATE 20 MG PO TABS: 20.0000 mg | ORAL_TABLET | Freq: Every day | ORAL | 1 refills | Status: DC

## 2023-08-27 MED ORDER — BUPROPION HCL ER (XL) 150 MG PO TB24: 150.0000 mg | ORAL_TABLET | Freq: Every day | ORAL | 1 refills | Status: DC

## 2023-08-27 MED ORDER — IRBESARTAN 300 MG PO TABS: ORAL_TABLET | ORAL | 3 refills | Status: DC

## 2023-08-27 NOTE — Assessment & Plan Note (Signed)
Chronic.  Not well controlled.  Some anhedonia.  Better on Lexapro 20mg  so continue.  Add wellbutrin XL 150mg 

## 2023-08-27 NOTE — Progress Notes (Signed)
 Subjective:     Patient ID: Angela Everett, female    DOB: [birth year], 57 y.o.   MRN: [id number]  Chief Complaint  Patient presents with   Medication Refill    Medication refill and follow-up    HPI Anxiety/Depression-on lexapro  20mg .   A lot of stressors-esp work, xitions.  Not exercising/eating well, drinking water. Still not doing creative things.  More complacent.  Tired at end of day.   No SI.  Wellbutrin  in past, but can't recall.    Concussion-fell thru broken chair-on 07/23/23-hit head twice and had weird taste/smell.  Now, ha, worsening.  Since at work, will have to see neuro.  Work restrictions of 2-4 hrs/day.  Low back bothering her as well. Workman's comp  HTN-Pt is on avapro  300mg  and hydrochlorothiazide  25mg  .  Bp's running  not checking.  No dizziness/cp/palp/edema/cough/sob    Health Maintenance Due  Topic Date Due   INFLUENZA VACCINE  08/15/2023    Past Medical History:  Diagnosis Date   Annual physical exam 08/04/2021   Anxiety    Depression    Gallstones    GERD (gastroesophageal reflux disease)    Hyperlipidemia    Hypertension    Need for immunization against influenza 08/04/2021    Past Surgical History:  Procedure Laterality Date   CERVICAL SPINE SURGERY  2011   CESAREAN SECTION  2000, 2002, 2005   x 3   CHOLECYSTECTOMY  2006   COLONOSCOPY  07/07/2018   UPPER GI ENDOSCOPY  07/07/2018     Current Outpatient Medications:    acetaminophen  (TYLENOL ) 500 MG tablet, Take 1 tablet (500 mg total) by mouth every 6 (six) hours as needed., Disp: 30 tablet, Rfl: 0   Ascorbic Acid (VITAMIN C PO), Take by mouth. 2000 IU, Disp: , Rfl:    B Complex-C (SUPER B COMPLEX PO), Take by mouth., Disp: , Rfl:    buPROPion  (WELLBUTRIN  XL) 150 MG 24 hr tablet, Take 1 tablet (150 mg total) by mouth daily., Disp: 30 tablet, Rfl: 1   cetirizine  (ZYRTEC ) 10 MG tablet, Take 1 tablet (10 mg total) by mouth daily., Disp: 30 tablet, Rfl: 0   cholecalciferol (VITAMIN  D) 1000 units tablet, Take 1,000 Units by mouth daily., Disp: , Rfl:    milk thistle 175 MG tablet, Take 175 mg by mouth daily., Disp: , Rfl:    UNABLE TO FIND, Provitalize: Natural Probiotic and weightmanagement, Disp: , Rfl:    Zinc 50 MG TABS, Take by mouth., Disp: , Rfl:    escitalopram  (LEXAPRO ) 20 MG tablet, Take 1 tablet (20 mg total) by mouth daily., Disp: 90 tablet, Rfl: 1   hydrochlorothiazide  (HYDRODIURIL ) 25 MG tablet, Take 1 tablet (25 mg total) by mouth daily., Disp: 90 tablet, Rfl: 1   irbesartan  (AVAPRO ) 300 MG tablet, TAKE 1 TABLET(300 MG) BY MOUTH DAILY, Disp: 90 tablet, Rfl: 3  Allergies  Allergen Reactions   Latex    Diflucan  [Fluconazole ] Rash   ROS neg/noncontributory except as noted HPI/below      Objective:     BP (!) 140/82 (BP Location: Left Arm, Patient Position: Sitting, Cuff Size: Large)   Pulse 63   Temp 98 F (36.7 C) (Temporal)   Resp 18   Ht 5' 8 (1.727 m)   Wt 212 lb 2 oz (96.2 kg)   LMP 06/13/2018   SpO2 98%   BMI 32.25 kg/m  Wt Readings from Last 3 Encounters:  08/27/23 212 lb 2 oz (96.2 kg)  10/15/22 192 lb 8 oz (87.3 kg)  08/14/22 199 lb 2 oz (90.3 kg)    Physical Exam   Gen: WDWN NAD HEENT: NCAT, conjunctiva not injected, sclera nonicteric NECK:  supple, no thyromegaly, no nodes, no carotid bruits CARDIAC: RRR, S1S2+, no murmur. DP 2+B LUNGS: CTAB. No wheezes ABDOMEN:  BS+, soft, NTND, No HSM, no masses EXT:  no edema MSK: no gross abnormalities.  NEURO: A&O x3.  CN II-XII intact.  PSYCH: normal mood. Good eye contact     Assessment & Plan:  Hypertension, essential, benign Assessment & Plan: Chronic.  Fair control.  Continue hydrochlorothiazide  25mg  and irbesartan  300mg  daily.  Work on diet/exercise  Orders: -     hydroCHLOROthiazide ; Take 1 tablet (25 mg total) by mouth daily.  Dispense: 90 tablet; Refill: 1 -     Irbesartan ; TAKE 1 TABLET(300 MG) BY MOUTH DAILY  Dispense: 90 tablet; Refill: 3  Anxiety and  depression Assessment & Plan: Chronic.  Not well controlled.  Some anhedonia.  Better on Lexapro  20mg  so continue.  Add wellbutrin  XL 150mg   Orders: -     buPROPion  HCl ER (XL); Take 1 tablet (150 mg total) by mouth daily.  Dispense: 30 tablet; Refill: 1  Other orders -     Escitalopram  Oxalate; Take 1 tablet (20 mg total) by mouth daily.  Dispense: 90 tablet; Refill: 1    Return in about 4 weeks (around 09/24/2023) for annual physical, mood.  Jenkins CHRISTELLA Carrel, MD

## 2023-08-27 NOTE — Assessment & Plan Note (Signed)
Chronic.  Fair control.  Continue hydrochlorothiazide 25mg  and irbesartan 300mg  daily.  Work on diet/exercise

## 2023-08-27 NOTE — Patient Instructions (Addendum)
 It was very nice to see you today!  Happy new Year  Add wellbutrin -in am  Call about neurology   PLEASE NOTE:  If you had any lab tests please let us  know if you have not heard back within a few days. You may see your results on MyChart before we have a chance to review them but we will give you a call once they are reviewed by us . If we ordered any referrals today, please let us  know if you have not heard from their office within the next week.   Please try these tips to maintain a healthy lifestyle:  Eat most of your calories during the day when you are active. Eliminate processed foods including packaged sweets (pies, cakes, cookies), reduce intake of potatoes, white bread, white pasta, and white rice. Look for whole grain options, oat flour or almond flour.  Each meal should contain half fruits/vegetables, one quarter protein, and one quarter carbs (no bigger than a computer mouse).  Cut down on sweet beverages. This includes juice, soda, and sweet tea. Also watch fruit intake, though this is a healthier sweet option, it still contains natural sugar! Limit to 3 servings daily.  Drink at least 1 glass of water with each meal and aim for at least 8 glasses per day  Exercise at least 150 minutes every week.

## 2023-09-18 ENCOUNTER — Other Ambulatory Visit: Payer: Self-pay | Admitting: Family Medicine

## 2023-09-18 DIAGNOSIS — F32A Depression, unspecified: Secondary | ICD-10-CM

## 2023-09-27 ENCOUNTER — Encounter: Payer: Self-pay | Admitting: Family Medicine

## 2023-09-27 ENCOUNTER — Ambulatory Visit (INDEPENDENT_AMBULATORY_CARE_PROVIDER_SITE_OTHER): Payer: Self-pay | Admitting: Family Medicine

## 2023-09-27 VITALS — BP 112/78 | HR 66 | Temp 98.1°F | Resp 18 | Ht 68.0 in | Wt 204.4 lb

## 2023-09-27 DIAGNOSIS — Z0000 Encounter for general adult medical examination without abnormal findings: Secondary | ICD-10-CM

## 2023-09-27 DIAGNOSIS — Z131 Encounter for screening for diabetes mellitus: Secondary | ICD-10-CM

## 2023-09-27 LAB — CBC WITH DIFFERENTIAL/PLATELET
Basophils Absolute: 0 10*3/uL (ref 0.0–0.1)
Basophils Relative: 0.7 % (ref 0.0–3.0)
Eosinophils Absolute: 0.1 10*3/uL (ref 0.0–0.7)
Eosinophils Relative: 1.9 % (ref 0.0–5.0)
HCT: 41.3 % (ref 36.0–46.0)
Hemoglobin: 14 g/dL (ref 12.0–15.0)
Lymphocytes Relative: 41.3 % (ref 12.0–46.0)
Lymphs Abs: 1.9 10*3/uL (ref 0.7–4.0)
MCHC: 33.8 g/dL (ref 30.0–36.0)
MCV: 92.6 fL (ref 78.0–100.0)
Monocytes Absolute: 0.3 10*3/uL (ref 0.1–1.0)
Monocytes Relative: 6.6 % (ref 3.0–12.0)
Neutro Abs: 2.3 10*3/uL (ref 1.4–7.7)
Neutrophils Relative %: 49.5 % (ref 43.0–77.0)
Platelets: 273 10*3/uL (ref 150.0–400.0)
RBC: 4.46 Mil/uL (ref 3.87–5.11)
RDW: 13.7 % (ref 11.5–15.5)
WBC: 4.7 10*3/uL (ref 4.0–10.5)

## 2023-09-27 LAB — COMPREHENSIVE METABOLIC PANEL
ALT: 16 U/L (ref 0–35)
AST: 21 U/L (ref 0–37)
Albumin: 4.6 g/dL (ref 3.5–5.2)
Alkaline Phosphatase: 63 U/L (ref 39–117)
BUN: 13 mg/dL (ref 6–23)
CO2: 28 meq/L (ref 19–32)
Calcium: 9.8 mg/dL (ref 8.4–10.5)
Chloride: 101 meq/L (ref 96–112)
Creatinine, Ser: 0.82 mg/dL (ref 0.40–1.20)
GFR: 79.67 mL/min (ref >60.00)
Glucose, Bld: 92 mg/dL (ref 70–99)
Potassium: 4 meq/L (ref 3.5–5.1)
Sodium: 138 meq/L (ref 135–145)
Total Bilirubin: 0.6 mg/dL (ref 0.2–1.2)
Total Protein: 7.4 g/dL (ref 6.0–8.3)

## 2023-09-27 LAB — LIPID PANEL
Cholesterol: 241 mg/dL — ABNORMAL HIGH (ref 0–200)
HDL: 71.5 mg/dL (ref >39.00)
LDL Cholesterol: 153 mg/dL — ABNORMAL HIGH (ref 0–99)
NonHDL: 169.43
Total CHOL/HDL Ratio: 3
Triglycerides: 80 mg/dL (ref 0.0–149.0)
VLDL: 16 mg/dL (ref 0.0–40.0)

## 2023-09-27 LAB — TSH: TSH: 1.32 u[IU]/mL (ref 0.35–5.50)

## 2023-09-27 LAB — HEMOGLOBIN A1C: Hgb A1c MFr Bld: 5.9 % (ref 4.6–6.5)

## 2023-09-27 NOTE — Patient Instructions (Addendum)
It was very nice to see you today! Get mammogram sch   PLEASE NOTE:  If you had any lab tests please let us know if you have not heard back within a few days. You may see your results on MyChart before we have a chance to review them but we will give you a call once they are reviewed by [place]. If we ordered any referrals today, please let us know if you have not heard from their office within the next week.   Please try these tips to maintain a healthy lifestyle:  Eat most of your calories during the day when you are active. Eliminate processed foods including packaged sweets (pies, cakes, cookies), reduce intake of potatoes, white bread, white pasta, and white rice. Look for whole grain options, oat flour or almond flour.  Each meal should contain half fruits/vegetables, one quarter protein, and one quarter carbs (no bigger than a computer mouse).  Cut down on sweet beverages. This includes juice, soda, and sweet tea. Also watch fruit intake, though this is a healthier sweet option, it still contains natural sugar! Limit to 3 servings daily.  Drink at least 1 glass of water with each meal and aim for at least 8 glasses per day  Exercise at least 150 minutes every week.
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
## 2023-09-29 ENCOUNTER — Encounter: Payer: Self-pay | Admitting: Family Medicine

## 2023-09-29 NOTE — Progress Notes (Signed)
Labs ok except 1.  A1C(3 month average of sugars) is elevated.  This is considered PreDiabetes.  Work on diet-decrease sugars and starches and aim for 30 minutes of exercise 5 days/week to prevent progression to diabetes  2.  Your cholesterol levels are elevated.  Work on low cholesterol and lower carbs/sugars diet and  get exercise to try to lower your cholesterol.  Does she want to work more on diet/exercise and/or start meds?  If meds, atorvastatin 20mg  at hs #90/1 and repeat lipids,lft 3 mo

## 2023-09-30 ENCOUNTER — Other Ambulatory Visit: Payer: Self-pay | Admitting: Family Medicine

## 2023-09-30 DIAGNOSIS — Z1231 Encounter for screening mammogram for malignant neoplasm of breast: Secondary | ICD-10-CM

## 2023-10-08 ENCOUNTER — Encounter: Payer: Self-pay | Admitting: Family Medicine

## 2023-10-08 DIAGNOSIS — Z1231 Encounter for screening mammogram for malignant neoplasm of breast: Secondary | ICD-10-CM

## 2023-10-11 ENCOUNTER — Ambulatory Visit
Admission: RE | Admit: 2023-10-11 | Discharge: 2023-10-11 | Disposition: A | Discharge status: Home / Self Care | Payer: 59 | Source: Ambulatory Visit | Attending: Family Medicine | Admitting: Family Medicine

## 2023-10-11 DIAGNOSIS — Z1231 Encounter for screening mammogram for malignant neoplasm of breast: Secondary | ICD-10-CM

## 2023-10-16 ENCOUNTER — Encounter: Payer: Self-pay | Admitting: Family Medicine

## 2024-02-25 ENCOUNTER — Encounter: Payer: Self-pay | Admitting: Family Medicine

## 2024-02-25 ENCOUNTER — Ambulatory Visit: Payer: Self-pay | Admitting: Family Medicine

## 2024-02-25 ENCOUNTER — Ambulatory Visit (INDEPENDENT_AMBULATORY_CARE_PROVIDER_SITE_OTHER): Payer: Self-pay | Admitting: Family Medicine

## 2024-02-25 VITALS — BP 132/83 | HR 58 | Temp 97.7°F | Resp 18 | Ht 68.0 in | Wt 195.2 lb

## 2024-02-25 DIAGNOSIS — I10 Essential (primary) hypertension: Secondary | ICD-10-CM | POA: Diagnosis not present

## 2024-02-25 DIAGNOSIS — F419 Anxiety disorder, unspecified: Secondary | ICD-10-CM | POA: Diagnosis not present

## 2024-02-25 DIAGNOSIS — F32A Depression, unspecified: Secondary | ICD-10-CM

## 2024-02-25 DIAGNOSIS — E7800 Pure hypercholesterolemia, unspecified: Secondary | ICD-10-CM | POA: Diagnosis not present

## 2024-02-25 DIAGNOSIS — R7303 Prediabetes: Secondary | ICD-10-CM | POA: Diagnosis not present

## 2024-02-25 LAB — COMPREHENSIVE METABOLIC PANEL WITH GFR
ALT: 15 U/L (ref 0–35)
AST: 19 U/L (ref 0–37)
Albumin: 4.5 g/dL (ref 3.5–5.2)
Alkaline Phosphatase: 60 U/L (ref 39–117)
BUN: 16 mg/dL (ref 6–23)
CO2: 30 meq/L (ref 19–32)
Calcium: 9.8 mg/dL (ref 8.4–10.5)
Chloride: 100 meq/L (ref 96–112)
Creatinine, Ser: 0.76 mg/dL (ref 0.40–1.20)
GFR: 87.02 mL/min (ref >60.00)
Glucose, Bld: 95 mg/dL (ref 70–99)
Potassium: 3.7 meq/L (ref 3.5–5.1)
Sodium: 138 meq/L (ref 135–145)
Total Bilirubin: 0.4 mg/dL (ref 0.2–1.2)
Total Protein: 7.7 g/dL (ref 6.0–8.3)

## 2024-02-25 LAB — LIPID PANEL
Cholesterol: 266 mg/dL — ABNORMAL HIGH (ref 0–200)
HDL: 77.2 mg/dL (ref >39.00)
LDL Cholesterol: 174 mg/dL — ABNORMAL HIGH (ref 0–99)
NonHDL: 188.87
Total CHOL/HDL Ratio: 3
Triglycerides: 76 mg/dL (ref 0.0–149.0)
VLDL: 15.2 mg/dL (ref 0.0–40.0)

## 2024-02-25 LAB — HEMOGLOBIN A1C: Hgb A1c MFr Bld: 5.9 % (ref 4.6–6.5)

## 2024-02-25 MED ORDER — IRBESARTAN 300 MG PO TABS
ORAL_TABLET | ORAL | 3 refills | Status: AC
Start: 2024-02-25 — End: ?

## 2024-02-25 MED ORDER — HYDROCHLOROTHIAZIDE 25 MG PO TABS: 25.0000 mg | ORAL_TABLET | Freq: Every day | ORAL | 1 refills | Status: AC

## 2024-02-25 MED ORDER — ESCITALOPRAM OXALATE 20 MG PO TABS
20.0000 mg | ORAL_TABLET | Freq: Every day | ORAL | 1 refills | Status: DC
Start: 2024-02-25 — End: 2024-07-21

## 2024-02-25 NOTE — Progress Notes (Signed)
 Sugars stable-prediabetes Cholesterol worse-not sure if she wants to continue diet/exercise alone or add lipitor 20mg  daily?

## 2024-02-25 NOTE — Progress Notes (Signed)
 Subjective:     Patient ID: Angela Everett, female    DOB: 05-14-1966, 58 y.o.   MRN: [id number]  Chief Complaint  Patient presents with   Medical Management of Chronic Issues    5 month follow-up on htn, mood Fasting     HPI Discussed the use of AI scribe software for clinical note transcription with the patient, who gave verbal consent to proceed.  History of Present Illness Angela Everett is a 58 year old female with hypertension, hyperlipidemia, prediabetes, anxiety, and depression who presents for follow-up.  She is planning to schedule cataract surgery soon due to significant vision impairment in her left eye, with the possibility of requiring surgery in both eyes. This has contributed to persistent headaches and occasional dizziness since a concussion in November, which she attributes to visual disturbances and sinus issues related to a root canal that needs to be redone. A CT showed no abnormalities.  For hypertension, she is taking irbesartan  300 mg and hydrochlorothiazide  25 mg, although she did not take her medication this morning due to uncertainty about blood work. She does not check her blood pressure regularly due to ongoing stress and depression. She experienced a period of increased physical activity and weight loss, losing 17 pounds, but has recently struggled with maintaining her routine due to stress and life changes. No cp/palp/sob  Regarding her mental health, she continues to experience stress and depression, exacerbated by recent life events such as her son's graduation and a stressful trip to [place]. She is seeing a counselor weekly and feels she is on the right track. She is taking Lexapro  20 mg for anxiety and depression, which she feels is sufficient despite recent worsening of symptoms. No suicidal thoughts.  For hyperlipidemia and prediabetes, she is working on lifestyle changes, including intermittent fasting, though she acknowledges a  familial tendency towards high cholesterol. She was previously on Lipitor but is not currently taking it. Her family history includes high cholesterol despite fitness, and her father had Marfan syndrome.  She has a history of an enlarged aorta-borderline, discovered incidentally during a workup for a panic attack, and was switched to irbesartan  for better blood pressure control. She has not seen a cardiologist since the initial finding and was told no f/u needed.    Health Maintenance Due  Topic Date Due   Hepatitis B Vaccines (1 of 3 - 19+ 3-dose series) Never done    Past Medical History:  Diagnosis Date   Annual physical exam 08/04/2021   Anxiety    Depression    Gallstones    GERD (gastroesophageal reflux disease)    Hyperlipidemia    Hypertension    Need for immunization against influenza 08/04/2021    Past Surgical History:  Procedure Laterality Date   CERVICAL SPINE SURGERY  2011   CESAREAN SECTION  2000, 2002, 2005   x 3   CHOLECYSTECTOMY  2006   COLONOSCOPY  07/07/2018   UPPER GI ENDOSCOPY  07/07/2018     Current Outpatient Medications:    acetaminophen  (TYLENOL ) 500 MG tablet, Take 1 tablet (500 mg total) by mouth every 6 (six) hours as needed., Disp: 30 tablet, Rfl: 0   Ascorbic Acid (VITAMIN C PO), Take by mouth. 2000 IU, Disp: , Rfl:    B Complex-C (SUPER B COMPLEX PO), Take by mouth., Disp: , Rfl:    cetirizine  (ZYRTEC ) 10 MG tablet, Take 1 tablet (10 mg total) by mouth daily., Disp: 30 tablet, Rfl: 0  cholecalciferol (VITAMIN D ) 1000 units tablet, Take 1,000 Units by mouth daily., Disp: , Rfl:    gabapentin  (NEURONTIN ) 100 MG capsule, Take 100 mg by mouth as needed (takes a couple of times a week)., Disp: , Rfl:    milk thistle 175 MG tablet, Take 175 mg by mouth daily., Disp: , Rfl:    UNABLE TO FIND, Provitalize: Natural Probiotic and weightmanagement, Disp: , Rfl:    Zinc 50 MG TABS, Take by mouth., Disp: , Rfl:    escitalopram  (LEXAPRO ) 20 MG tablet, Take  1 tablet (20 mg total) by mouth daily., Disp: 90 tablet, Rfl: 1   hydrochlorothiazide  (HYDRODIURIL ) 25 MG tablet, Take 1 tablet (25 mg total) by mouth daily., Disp: 90 tablet, Rfl: 1   irbesartan  (AVAPRO ) 300 MG tablet, TAKE 1 TABLET(300 MG) BY MOUTH DAILY, Disp: 90 tablet, Rfl: 3  Allergies  Allergen Reactions   Latex    Diflucan  [Fluconazole ] Rash   ROS neg/noncontributory except as noted HPI/below      Objective:     BP 132/83   Pulse (!) 58   Temp 97.7 F (36.5 C) (Temporal)   Resp 18   Ht 5' 8 (1.727 m)   Wt 195 lb 4 oz (88.6 kg)   LMP 06/13/2018   SpO2 100%   BMI 29.69 kg/m  Wt Readings from Last 3 Encounters:  02/25/24 195 lb 4 oz (88.6 kg)  09/27/23 204 lb 6 oz (92.7 kg)  08/27/23 212 lb 2 oz (96.2 kg)    Physical Exam   Gen: WDWN NAD HEENT: NCAT, conjunctiva not injected, sclera nonicteric NECK:  supple, no thyromegaly, no nodes, no carotid bruits CARDIAC: RRR, S1S2+, no murmur. DP 2+B LUNGS: CTAB. No wheezes ABDOMEN:  BS+, soft, NTND, No HSM, no masses EXT:  no edema MSK: no gross abnormalities.  NEURO: A&O x3.  CN II-XII intact.  PSYCH: normal mood. Good eye contact     Assessment & Plan:  Hypertension, essential, benign -     Comprehensive metabolic panel with GFR -     hydroCHLOROthiazide ; Take 1 tablet (25 mg total) by mouth daily.  Dispense: 90 tablet; Refill: 1 -     Irbesartan ; TAKE 1 TABLET(300 MG) BY MOUTH DAILY  Dispense: 90 tablet; Refill: 3  Pure hypercholesterolemia -     Comprehensive metabolic panel with GFR -     Lipid panel  Prediabetes -     Comprehensive metabolic panel with GFR -     Hemoglobin A1c  Anxiety and depression -     Escitalopram  Oxalate; Take 1 tablet (20 mg total) by mouth daily.  Dispense: 90 tablet; Refill: 1  Assessment and Plan Assessment & Plan Hypertension   chronic.  controlled Hypertension is managed with irbesartan  300 mg and hydrochlorothiazide  25 mg. She missed her dose this morning due to  uncertainty about blood work. Blood pressure monitoring has been inconsistent because of stress and life events. She experiences headaches and dizziness, likely related to concussion and cataract issues, but no chest pain or shortness of breath. She understands the importance of lifestyle modifications to manage stress and improve blood pressure. Continue irbesartan  300 mg and hydrochlorothiazide  25 mg. Encourage regular blood pressure monitoring and discuss lifestyle changes to manage stress and improve control.  Prediabetes   She is aware of her prediabetes diagnosis and is implementing lifestyle modifications, including intermittent fasting and exercise. She has lost 17 pounds, though recent stress has affected her diet and activity. Emphasize the importance of lifestyle  changes to prevent diabetes progression. Encourage continuation of lifestyle modifications and monitor blood glucose levels regularly.  Hyperlipidemia   With a familial history of hyperlipidemia but no significant family history of heart disease, she was previously on atorvastatin without adverse effects. Current cholesterol levels are not discussed, but medication may be considered based on risk-benefit analysis. She is working on lifestyle modifications to manage cholesterol. Continue lifestyle modifications and discuss potential medication if cholesterol levels remain high.  Anxiety and Depression   She takes escitalopram  20 mg for anxiety and depression. Recent stress and life changes have worsened her condition, but she does not feel medication adjustment is needed. She attends weekly counseling and recognizes the importance of exercise for mental health. No suicidal ideation is reported. Continue escitalopram  20 mg, weekly counseling, and encourage regular exercise. 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 Symptoms may be worsened by stress, cataracts, and dental issues. Monitor symptoms and manage contributing factors such as stress and vision issues.  Cataracts   Significant vision impairment in the left eye due to cataracts, with the right eye also affected. She is awaiting cataract surgery with Dr. Milan. Cataracts may contribute to headaches and visual disturbances. Schedule cataract surgery with Dr. Milan.  Dental Issues   A fistula and need for a root canal redo may contribute to sinus-related symptoms and headaches. The dental issue is near the sinuses, potentially worsening symptoms. Address dental issues, including root canal redo.    Return in about 6 months (around 08/27/2024) for annual physical.  Jenkins CHRISTELLA Carrel, MD

## 2024-02-25 NOTE — Patient Instructions (Signed)

## 2024-07-21 ENCOUNTER — Encounter: Payer: Self-pay | Admitting: Physician Assistant

## 2024-07-21 ENCOUNTER — Ambulatory Visit: Admitting: Physician Assistant

## 2024-07-21 ENCOUNTER — Ambulatory Visit: Payer: Self-pay

## 2024-07-21 VITALS — BP 100/70 | HR 67 | Temp 97.3°F | Ht 68.0 in | Wt 194.0 lb

## 2024-07-21 DIAGNOSIS — J101 Influenza due to other identified influenza virus with other respiratory manifestations: Secondary | ICD-10-CM | POA: Diagnosis not present

## 2024-07-21 LAB — POC COVID19 BINAXNOW: SARS Coronavirus 2 Ag: NEGATIVE

## 2024-07-21 LAB — POC INFLUENZA A&B (BINAX/QUICKVUE)
Influenza A, POC: POSITIVE — AB
Influenza B, POC: NEGATIVE

## 2024-07-21 MED ORDER — BENZONATATE 100 MG PO CAPS: 100.0000 mg | ORAL_CAPSULE | Freq: Two times a day (BID) | ORAL | 0 refills | Status: AC | PRN

## 2024-07-21 MED ORDER — AZITHROMYCIN 250 MG PO TABS: ORAL_TABLET | ORAL | 0 refills | Status: AC

## 2024-07-21 MED ORDER — PREDNISONE 20 MG PO TABS: 20.0000 mg | ORAL_TABLET | Freq: Two times a day (BID) | ORAL | 0 refills | Status: AC

## 2024-07-21 NOTE — Telephone Encounter (Signed)
 FYI Only or Action Required?: FYI only for provider: appointment scheduled on 07/21/24.  Patient was last seen in primary care on 02/25/2024 by Angela Jenkins Jansky, MD.  Called Nurse Triage reporting Cough.  Symptoms began several days ago.  Interventions attempted: OTC medications: Mucinex and hydration.  Symptoms are: gradually worsening.  Triage Disposition: Home Care  Patient/caregiver understands and will follow disposition?:  Reason for Disposition  Mild central chest pain that only occurs when coughing is also present  Answer Assessment - Initial Assessment Questions Cough x 4 days. Taking Mucinex. Also reports malodorous urine.  1. ONSET: When did the cough begin?      4 days 2. SEVERITY: How bad is the cough today?      Severe during fits, not constant 3. SPUTUM: Describe the color of your sputum (e.g., none, dry cough; clear, white, yellow, green)     Nonproductive 4. HEMOPTYSIS: Are you coughing up any blood? If Yes, ask: How much? (e.g., flecks, streaks, tablespoons, etc.)     Denies 5. FEVER:     Last night was 101, resolved today  Protocols used: Cough - Acute Non-Productive-A-AH Copied from CRM #[id number]. Topic: Clinical - Red Word Triage >> Jul 21, 2024  8:03 AM Berneda FALCON wrote: Red Word that prompted transfer to Nurse Triage: Patient has had a fever up and down since Saturday with the highest being 103. Thought the fever broke but came back yesterday.  Cough is persistent, not productive, chest pain and soreness in ribs from coughing so hard.

## 2024-07-21 NOTE — Progress Notes (Signed)
 History of Present Illness:   Chief Complaint  Patient presents with   Cough    Pt c/o cough and chest congestion since Sunday, pt had high fever Saturday night 103, last night was 101.8, no fever today, diarrhea.    Discussed the use of AI scribe software for clinical note transcription with the patient, who gave verbal consent to proceed.  History of Present Illness   Angela Everett is a 58 year old female who presents with fever, cough, and chest pain.  Her symptoms began after working late on Saturday night with fever to 103F and feeling mentally "out of touch." She has not taken antipyretics. She has an intermittent but severe cough that can cause gagging and a raw throat. She has taken an expectorant since Saturday without relief. She has significant substernal chest pain that she thinks is muscular and is tender to palpation, with no hemoptysis and no severe sore throat, ear pain, or sinus pressure.  She reports diarrhea and notes her urine seems abnormal, though she does not describe dysuria or hematuria. She has diffuse finger and foot pain and worries about an autoimmune cause.  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 She has missed work at both jobs due to this illness.   She reports that she has not taken any antipyretics for this infection.       Past Medical History:  Diagnosis Date   Annual physical exam 08/04/2021   Anxiety    Depression    Gallstones    GERD (gastroesophageal reflux disease)    Hyperlipidemia    Hypertension    Need for immunization against influenza 08/04/2021     Social History   Tobacco Use   Smoking status: Former    Current packs/day: 0.00    Types: Cigarettes    Quit date: 1999    Years since quitting: 26.9   Smokeless tobacco: Never  Vaping Use   Vaping status: Never Used  Substance Use Topics   Alcohol use: Yes    Comment: occ   Drug  use: No    Past Surgical History:  Procedure Laterality Date   CERVICAL SPINE SURGERY  2011   CESAREAN SECTION  2000, 2002, 2005   x 3   CHOLECYSTECTOMY  2006   COLONOSCOPY  07/07/2018   UPPER GI ENDOSCOPY  07/07/2018    Family History  Problem Relation Age of Onset   Depression Mother    Heart disease Mother        mvp   Hypertension Mother    Birth defects Father    Cancer Father        mets   Heart disease Father    Marfan syndrome Father    ADD / ADHD Daughter    Alcohol abuse Daughter    Depression Daughter    Drug abuse Daughter    Learning disabilities Daughter    Allergy (severe) Daughter    Depression Daughter    Hearing loss Daughter    Celiac disease Daughter    Depression Son    Allergy (severe) Son        Eosinophilic esophagitis   Heart disease Maternal Grandmother    Heart attack Maternal Grandmother    Cancer Paternal Grandmother        brain tumor   Early death Paternal Grandmother    Alcohol abuse Paternal Actor  Depression Paternal Grandfather    Bladder Cancer Paternal Grandfather     Allergies  Allergen Reactions   Latex    Diflucan  [Fluconazole ] Rash    Current Medications:   Current Outpatient Medications:    acetaminophen  (TYLENOL ) 500 MG tablet, Take 1 tablet (500 mg total) by mouth every 6 (six) hours as needed., Disp: 30 tablet, Rfl: 0   Ascorbic Acid (VITAMIN C PO), Take by mouth. 2000 IU, Disp: , Rfl:    azithromycin  (ZITHROMAX ) 250 MG tablet, Take 2 tablets on day 1, then 1 tablet daily on days 2 through 5, Disp: 6 tablet, Rfl: 0   B Complex-C (SUPER B COMPLEX PO), Take by mouth., Disp: , Rfl:    benzonatate  (TESSALON ) 100 MG capsule, Take 1 capsule (100 mg total) by mouth 2 (two) times daily as needed for cough., Disp: 20 capsule, Rfl: 0   cetirizine  (ZYRTEC ) 10 MG tablet, Take 1 tablet (10 mg total) by mouth daily., Disp: 30 tablet, Rfl: 0   cholecalciferol (VITAMIN D ) 1000 units tablet, Take 1,000 Units by mouth  daily., Disp: , Rfl:    cycloSPORINE (RESTASIS) 0.05 % ophthalmic emulsion, Place into both eyes 2 (two) times daily., Disp: , Rfl:    hydrochlorothiazide  (HYDRODIURIL ) 25 MG tablet, Take 1 tablet (25 mg total) by mouth daily., Disp: 90 tablet, Rfl: 1   irbesartan  (AVAPRO ) 300 MG tablet, TAKE 1 TABLET(300 MG) BY MOUTH DAILY, Disp: 90 tablet, Rfl: 3   predniSONE  (DELTASONE ) 20 MG tablet, Take 1 tablet (20 mg total) by mouth 2 (two) times daily with a meal., Disp: 10 tablet, Rfl: 0   Zinc 50 MG TABS, Take by mouth., Disp: , Rfl:    Review of Systems:   Negative unless otherwise specified per HPI.  Vitals:   Vitals:   07/21/24 1329  BP: 100/70  Pulse: 67  Temp: (!) 97.3 F (36.3 C)  TempSrc: Temporal  SpO2: 99%  Weight: 194 lb (88 kg)  Height: 5' 8 (1.727 m)     Body mass index is 29.5 kg/m.  Physical Exam:   Physical Exam Vitals and nursing note reviewed.  Constitutional:      General: She is not in acute distress.    Appearance: She is well-developed. She is not ill-appearing or toxic-appearing.  HENT:     Head: Normocephalic and atraumatic.     Right Ear: Tympanic membrane, ear canal and external ear normal. Tympanic membrane is not erythematous, retracted or bulging.     Left Ear: Tympanic membrane, ear canal and external ear normal. Tympanic membrane is not erythematous, retracted or bulging.     Nose: Nose normal.     Right Sinus: No maxillary sinus tenderness or frontal sinus tenderness.     Left Sinus: No maxillary sinus tenderness or frontal sinus tenderness.     Mouth/Throat:     Pharynx: Uvula midline. Posterior oropharyngeal erythema present.  Eyes:     General: Lids are normal.     Conjunctiva/sclera: Conjunctivae normal.  Neck:     Trachea: Trachea normal.  Cardiovascular:     Rate and Rhythm: Normal rate and regular rhythm.     Pulses: Normal pulses.     Heart sounds: Normal heart sounds, S1 normal and S2 normal.  Pulmonary:     Effort: Pulmonary  effort is normal.     Breath sounds: Normal breath sounds. No decreased breath sounds, wheezing, rhonchi or rales.  Lymphadenopathy:     Cervical: No cervical adenopathy.  Skin:  General: Skin is warm and dry.  Neurological:     Mental Status: She is alert.     GCS: GCS eye subscore is 4. GCS verbal subscore is 5. GCS motor subscore is 6.  Psychiatric:        Speech: Speech normal.        Behavior: Behavior normal. Behavior is cooperative.    Results for orders placed or performed in visit on 07/21/24  POC Influenza A&B(BINAX/QUICKVUE)  Result Value Ref Range   Influenza A, POC Positive (A) Negative   Influenza B, POC Negative Negative  POC COVID-19 BinaxNow  Result Value Ref Range   SARS Coronavirus 2 Ag Negative Negative    Assessment and Plan:   Assessment and Plan    Influenza A  Positive influenza A test with fever, cough, and arthralgias. Symptoms consistent with influenza A. Discussed secondary infection risks and flu contagion precautions.  No red flags on discussion, patient is not in any obvious distress during our visit. Discussed progression of most viral illness, and recommended supportive care at this point in time. I did however provide pocket rx for oral azithromycin  and prednisone  should symptoms not improve as anticipated. Sent in tessalon  Perles 100 mg for her to take as needed for cough Advised naproxen for inflammation and pain. Instructed to avoid contact until fever-free for 24 hours and avoid high-risk individuals until symptoms improve. Provided work note for illness absence.      Lucie Buttner, PA-C

## 2024-07-27 ENCOUNTER — Telehealth: Payer: Self-pay

## 2024-07-27 NOTE — Telephone Encounter (Signed)
 Copied from CRM #[id number]. Topic: Clinical - Medication Question >> Jul 27, 2024 10:04 AM Shereese L wrote: Reason for CRM: Patient stated that her cough still persists  and wanted a call back to find out if she should start taking the medication

## 2024-07-28 DIAGNOSIS — H43811 Vitreous degeneration, right eye: Secondary | ICD-10-CM | POA: Diagnosis not present

## 2024-07-28 DIAGNOSIS — H04123 Dry eye syndrome of bilateral lacrimal glands: Secondary | ICD-10-CM | POA: Diagnosis not present

## 2024-07-30 DIAGNOSIS — F331 Major depressive disorder, recurrent, moderate: Secondary | ICD-10-CM | POA: Diagnosis not present

## 2024-08-06 DIAGNOSIS — F331 Major depressive disorder, recurrent, moderate: Secondary | ICD-10-CM | POA: Diagnosis not present

## 2024-08-13 DIAGNOSIS — F331 Major depressive disorder, recurrent, moderate: Secondary | ICD-10-CM | POA: Diagnosis not present

## 2024-09-28 ENCOUNTER — Encounter: Payer: Self-pay | Admitting: Family Medicine

## 2024-12-17 ENCOUNTER — Encounter: Payer: Self-pay | Admitting: Family Medicine
